# Patient Record
Sex: Female | Born: 1970
Health system: Southern US, Community
[De-identification: ages and names within clinical notes are randomized; demographics above are authoritative.]

## PROBLEM LIST (undated history)

## (undated) DIAGNOSIS — J3089 Other allergic rhinitis: Secondary | ICD-10-CM

## (undated) DIAGNOSIS — I10 Essential (primary) hypertension: Secondary | ICD-10-CM

## (undated) HISTORY — PX: ECTOPIC PREGNANCY SURGERY: SHX613

---

## 1997-12-08 ENCOUNTER — Other Ambulatory Visit: Admission: RE | Admit: 1997-12-08 | Discharge: 1997-12-08 | Payer: Self-pay | Admitting: Obstetrics

## 1997-12-08 ENCOUNTER — Other Ambulatory Visit: Admission: RE | Admit: 1997-12-08 | Discharge: 1997-12-08 | Payer: Self-pay | Admitting: Internal Medicine

## 1998-01-05 ENCOUNTER — Ambulatory Visit (HOSPITAL_COMMUNITY): Admission: RE | Admit: 1998-01-05 | Discharge: 1998-01-05 | Payer: Self-pay | Admitting: Obstetrics

## 1998-01-20 ENCOUNTER — Inpatient Hospital Stay (HOSPITAL_COMMUNITY): Admission: AD | Admit: 1998-01-20 | Discharge: 1998-01-20 | Payer: Self-pay | Admitting: Obstetrics

## 1998-03-24 ENCOUNTER — Ambulatory Visit (HOSPITAL_COMMUNITY): Admission: RE | Admit: 1998-03-24 | Discharge: 1998-03-24 | Payer: Self-pay | Admitting: Obstetrics

## 1998-04-23 ENCOUNTER — Inpatient Hospital Stay (HOSPITAL_COMMUNITY): Admission: AD | Admit: 1998-04-23 | Discharge: 1998-04-23 | Payer: Self-pay | Admitting: Obstetrics

## 1998-04-24 ENCOUNTER — Inpatient Hospital Stay (HOSPITAL_COMMUNITY): Admission: AD | Admit: 1998-04-24 | Discharge: 1998-04-24 | Payer: Self-pay | Admitting: Obstetrics

## 1998-05-09 ENCOUNTER — Inpatient Hospital Stay (HOSPITAL_COMMUNITY): Admission: AD | Admit: 1998-05-09 | Discharge: 1998-05-09 | Payer: Self-pay | Admitting: Obstetrics

## 1998-05-18 ENCOUNTER — Inpatient Hospital Stay (HOSPITAL_COMMUNITY): Admission: AD | Admit: 1998-05-18 | Discharge: 1998-05-18 | Payer: Self-pay | Admitting: Obstetrics

## 1998-06-01 ENCOUNTER — Inpatient Hospital Stay (HOSPITAL_COMMUNITY): Admission: AD | Admit: 1998-06-01 | Discharge: 1998-06-01 | Payer: Self-pay | Admitting: Obstetrics

## 1998-06-14 ENCOUNTER — Inpatient Hospital Stay (HOSPITAL_COMMUNITY): Admission: AD | Admit: 1998-06-14 | Discharge: 1998-06-14 | Payer: Self-pay | Admitting: Obstetrics

## 1998-06-27 ENCOUNTER — Inpatient Hospital Stay (HOSPITAL_COMMUNITY): Admission: AD | Admit: 1998-06-27 | Discharge: 1998-06-30 | Payer: Self-pay | Admitting: Obstetrics

## 1999-02-20 ENCOUNTER — Emergency Department (HOSPITAL_COMMUNITY): Admission: EM | Admit: 1999-02-20 | Discharge: 1999-02-20 | Payer: Self-pay | Admitting: Emergency Medicine

## 1999-03-12 ENCOUNTER — Emergency Department (HOSPITAL_COMMUNITY): Admission: EM | Admit: 1999-03-12 | Discharge: 1999-03-12 | Payer: Self-pay | Admitting: Emergency Medicine

## 1999-03-27 ENCOUNTER — Emergency Department (HOSPITAL_COMMUNITY): Admission: EM | Admit: 1999-03-27 | Discharge: 1999-03-27 | Payer: Self-pay | Admitting: Emergency Medicine

## 1999-04-10 ENCOUNTER — Emergency Department (HOSPITAL_COMMUNITY): Admission: EM | Admit: 1999-04-10 | Discharge: 1999-04-10 | Payer: Self-pay | Admitting: *Deleted

## 1999-04-11 ENCOUNTER — Emergency Department (HOSPITAL_COMMUNITY): Admission: EM | Admit: 1999-04-11 | Discharge: 1999-04-11 | Payer: Self-pay | Admitting: Emergency Medicine

## 1999-04-23 ENCOUNTER — Emergency Department (HOSPITAL_COMMUNITY): Admission: EM | Admit: 1999-04-23 | Discharge: 1999-04-23 | Payer: Self-pay | Admitting: *Deleted

## 1999-07-17 ENCOUNTER — Inpatient Hospital Stay (HOSPITAL_COMMUNITY): Admission: AD | Admit: 1999-07-17 | Discharge: 1999-07-17 | Payer: Self-pay | Admitting: *Deleted

## 1999-07-20 ENCOUNTER — Inpatient Hospital Stay (HOSPITAL_COMMUNITY): Admission: AD | Admit: 1999-07-20 | Discharge: 1999-07-20 | Payer: Self-pay | Admitting: *Deleted

## 2000-03-18 ENCOUNTER — Inpatient Hospital Stay (HOSPITAL_COMMUNITY): Admission: AD | Admit: 2000-03-18 | Discharge: 2000-03-18 | Payer: Self-pay | Admitting: *Deleted

## 2000-04-15 ENCOUNTER — Inpatient Hospital Stay (HOSPITAL_COMMUNITY): Admission: AD | Admit: 2000-04-15 | Discharge: 2000-04-15 | Payer: Self-pay | Admitting: Obstetrics

## 2000-05-10 ENCOUNTER — Inpatient Hospital Stay (HOSPITAL_COMMUNITY): Admission: AD | Admit: 2000-05-10 | Discharge: 2000-05-10 | Payer: Self-pay | Admitting: Obstetrics

## 2000-06-05 ENCOUNTER — Other Ambulatory Visit: Admission: RE | Admit: 2000-06-05 | Discharge: 2000-06-05 | Payer: Self-pay | Admitting: Obstetrics

## 2000-07-07 ENCOUNTER — Emergency Department (HOSPITAL_COMMUNITY): Admission: EM | Admit: 2000-07-07 | Discharge: 2000-07-07 | Payer: Self-pay

## 2000-07-14 ENCOUNTER — Emergency Department (HOSPITAL_COMMUNITY): Admission: EM | Admit: 2000-07-14 | Discharge: 2000-07-14 | Payer: Self-pay | Admitting: *Deleted

## 2000-12-09 ENCOUNTER — Inpatient Hospital Stay (HOSPITAL_COMMUNITY): Admission: AD | Admit: 2000-12-09 | Discharge: 2000-12-09 | Payer: Self-pay | Admitting: Obstetrics

## 2000-12-11 ENCOUNTER — Other Ambulatory Visit: Admission: RE | Admit: 2000-12-11 | Discharge: 2000-12-11 | Payer: Self-pay | Admitting: Obstetrics

## 2001-01-21 ENCOUNTER — Emergency Department (HOSPITAL_COMMUNITY): Admission: EM | Admit: 2001-01-21 | Discharge: 2001-01-21 | Payer: Self-pay | Admitting: *Deleted

## 2001-06-15 ENCOUNTER — Inpatient Hospital Stay (HOSPITAL_COMMUNITY): Admission: AD | Admit: 2001-06-15 | Discharge: 2001-06-15 | Payer: Self-pay | Admitting: Obstetrics

## 2001-06-28 ENCOUNTER — Inpatient Hospital Stay (HOSPITAL_COMMUNITY): Admission: AD | Admit: 2001-06-28 | Discharge: 2001-06-28 | Payer: Self-pay | Admitting: Obstetrics

## 2001-07-23 ENCOUNTER — Inpatient Hospital Stay (HOSPITAL_COMMUNITY): Admission: AD | Admit: 2001-07-23 | Discharge: 2001-07-26 | Payer: Self-pay | Admitting: Obstetrics

## 2001-08-26 ENCOUNTER — Emergency Department (HOSPITAL_COMMUNITY): Admission: EM | Admit: 2001-08-26 | Discharge: 2001-08-26 | Payer: Self-pay | Admitting: Emergency Medicine

## 2001-12-19 ENCOUNTER — Emergency Department (HOSPITAL_COMMUNITY): Admission: EM | Admit: 2001-12-19 | Discharge: 2001-12-19 | Payer: Self-pay | Admitting: Emergency Medicine

## 2002-08-14 ENCOUNTER — Emergency Department (HOSPITAL_COMMUNITY): Admission: EM | Admit: 2002-08-14 | Discharge: 2002-08-14 | Payer: Self-pay | Admitting: Emergency Medicine

## 2003-05-16 ENCOUNTER — Emergency Department (HOSPITAL_COMMUNITY): Admission: EM | Admit: 2003-05-16 | Discharge: 2003-05-16 | Payer: Self-pay | Admitting: Emergency Medicine

## 2003-07-05 ENCOUNTER — Emergency Department (HOSPITAL_COMMUNITY): Admission: EM | Admit: 2003-07-05 | Discharge: 2003-07-05 | Payer: Self-pay | Admitting: Emergency Medicine

## 2003-07-17 ENCOUNTER — Emergency Department (HOSPITAL_COMMUNITY): Admission: EM | Admit: 2003-07-17 | Discharge: 2003-07-17 | Payer: Self-pay | Admitting: Emergency Medicine

## 2004-01-28 ENCOUNTER — Inpatient Hospital Stay (HOSPITAL_COMMUNITY): Admission: AD | Admit: 2004-01-28 | Discharge: 2004-01-28 | Payer: Self-pay | Admitting: Obstetrics

## 2004-02-25 ENCOUNTER — Inpatient Hospital Stay (HOSPITAL_COMMUNITY): Admission: AD | Admit: 2004-02-25 | Discharge: 2004-02-25 | Payer: Self-pay | Admitting: Obstetrics

## 2004-03-31 ENCOUNTER — Emergency Department (HOSPITAL_COMMUNITY): Admission: EM | Admit: 2004-03-31 | Discharge: 2004-03-31 | Payer: Self-pay | Admitting: Emergency Medicine

## 2005-01-02 ENCOUNTER — Emergency Department (HOSPITAL_COMMUNITY): Admission: EM | Admit: 2005-01-02 | Discharge: 2005-01-02 | Payer: Self-pay | Admitting: Emergency Medicine

## 2007-01-18 ENCOUNTER — Emergency Department (HOSPITAL_COMMUNITY): Admission: EM | Admit: 2007-01-18 | Discharge: 2007-01-18 | Payer: Self-pay | Admitting: Emergency Medicine

## 2007-02-01 ENCOUNTER — Observation Stay (HOSPITAL_COMMUNITY): Admission: AD | Admit: 2007-02-01 | Discharge: 2007-02-02 | Payer: Self-pay | Admitting: Obstetrics

## 2007-04-13 ENCOUNTER — Emergency Department (HOSPITAL_COMMUNITY): Admission: EM | Admit: 2007-04-13 | Discharge: 2007-04-13 | Payer: Self-pay | Admitting: Emergency Medicine

## 2007-10-24 ENCOUNTER — Ambulatory Visit: Payer: Self-pay | Admitting: *Deleted

## 2008-01-25 ENCOUNTER — Emergency Department (HOSPITAL_COMMUNITY): Admission: EM | Admit: 2008-01-25 | Discharge: 2008-01-25 | Payer: Self-pay | Admitting: Emergency Medicine

## 2008-09-10 IMAGING — CR DG CHEST 2V
2 series · 2 of 2 positions shown · non-contrast
Comparison: None

CLINICAL DATA: Cough, congestion and sore throat

CHEST - TWO VIEWS

[w chest pa]
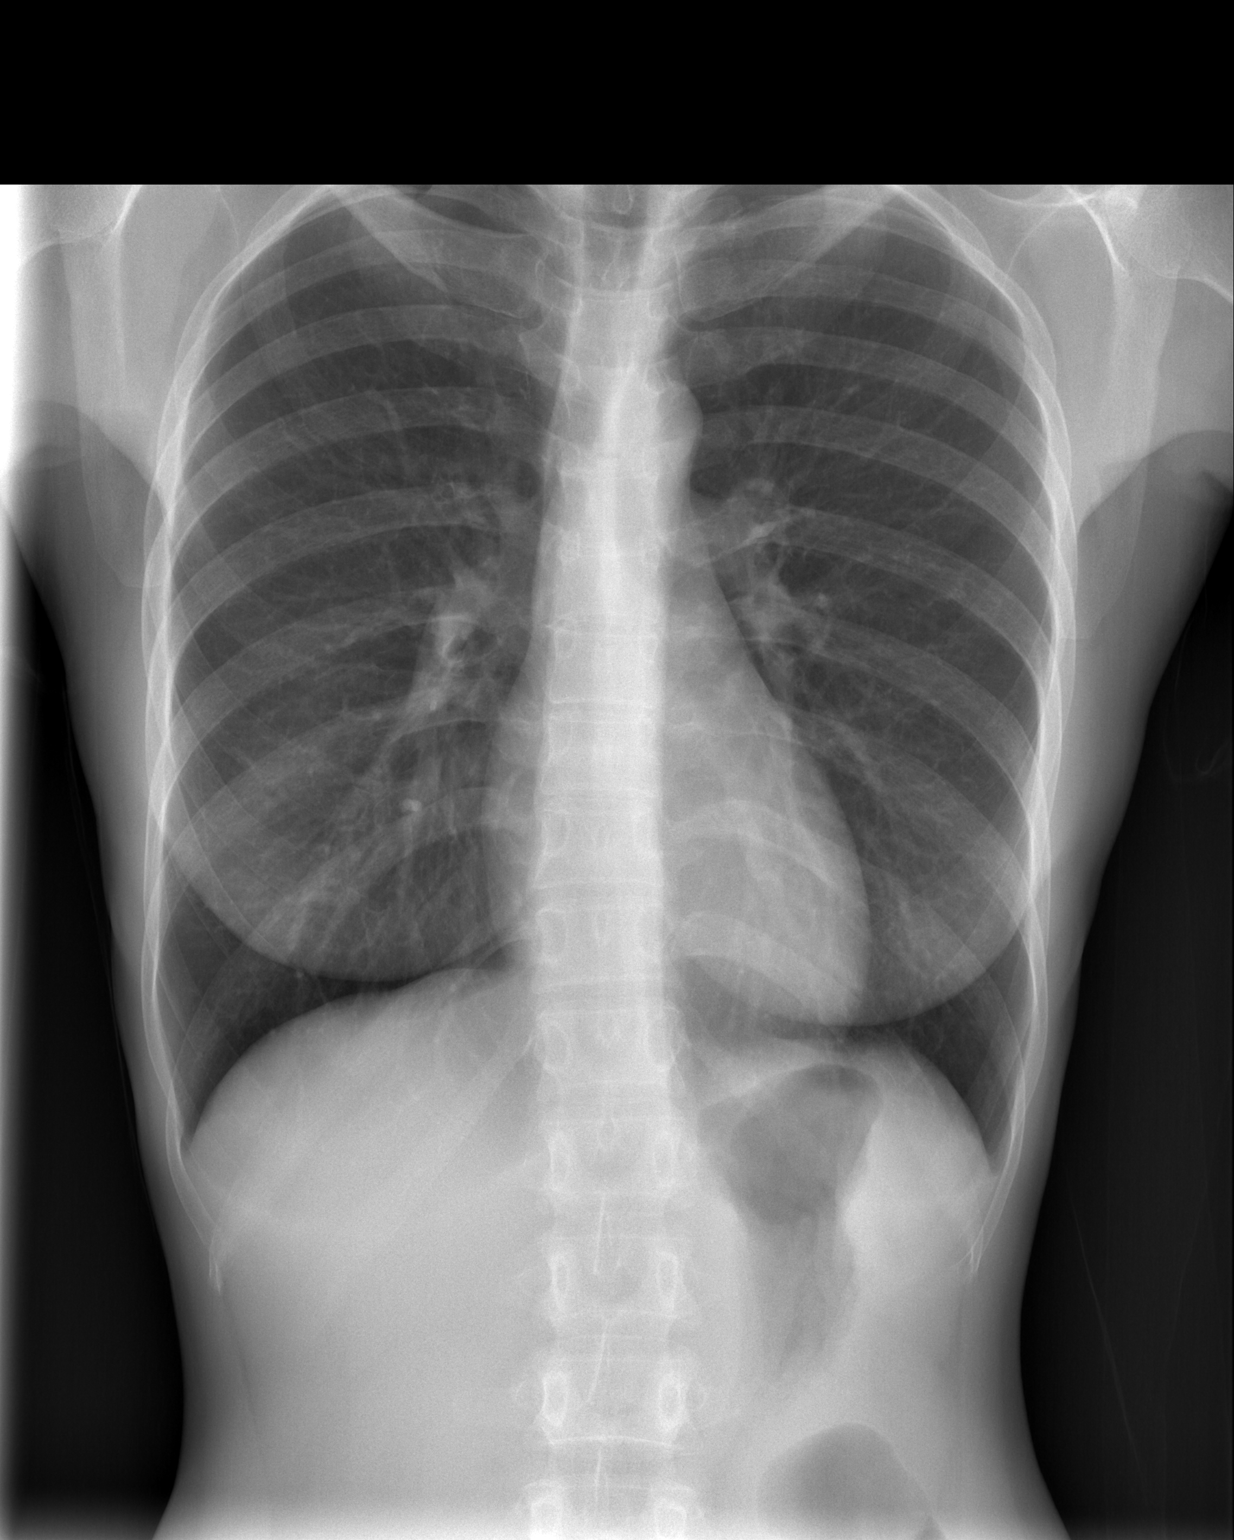

[w chest lat]
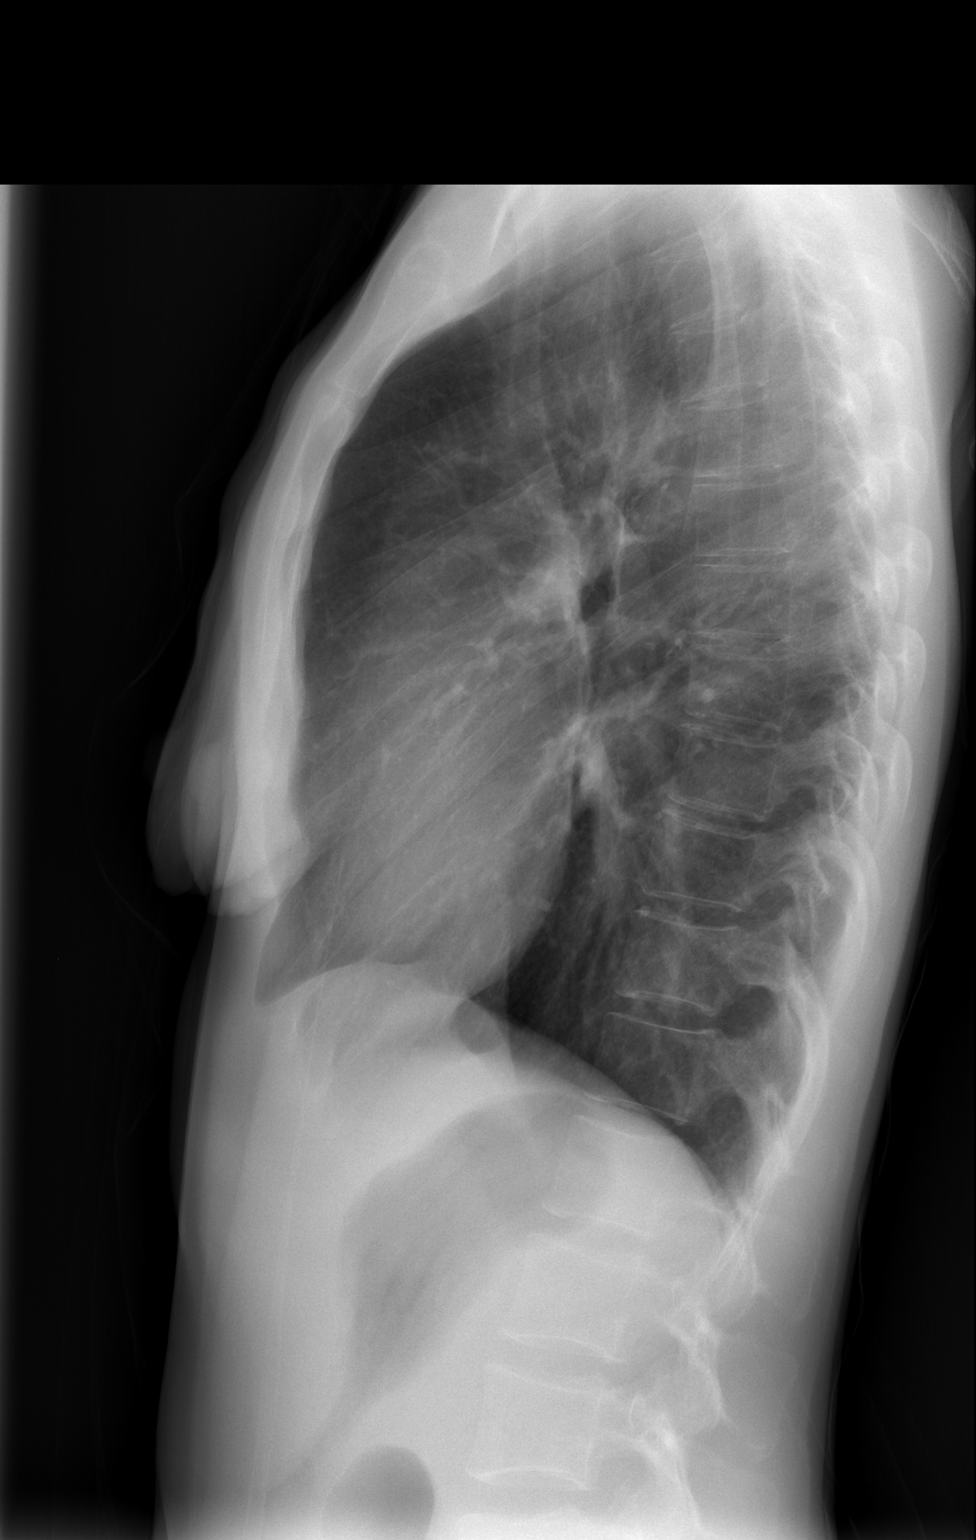

[2 of 2 positions shown; findings below may reference images not displayed]

FINDINGS: The heart size and mediastinal contours are within normal
limits.  Both lungs are clear.  No pleural effusions.  The bony
thorax is normal.
IMPRESSION: No active disease.

## 2008-11-15 ENCOUNTER — Emergency Department (HOSPITAL_COMMUNITY): Admission: EM | Admit: 2008-11-15 | Discharge: 2008-11-15 | Payer: Self-pay | Admitting: Emergency Medicine

## 2009-04-01 ENCOUNTER — Emergency Department (HOSPITAL_COMMUNITY): Admission: EM | Admit: 2009-04-01 | Discharge: 2009-04-01 | Payer: Self-pay | Admitting: Emergency Medicine

## 2010-11-02 ENCOUNTER — Emergency Department (HOSPITAL_BASED_OUTPATIENT_CLINIC_OR_DEPARTMENT_OTHER)
Admission: EM | Admit: 2010-11-02 | Discharge: 2010-11-02 | Disposition: A | Discharge status: Home / Self Care | Payer: Self-pay | Attending: Emergency Medicine | Admitting: Emergency Medicine

## 2010-11-02 DIAGNOSIS — R42 Dizziness and giddiness: Secondary | ICD-10-CM | POA: Insufficient documentation

## 2010-11-02 LAB — URINALYSIS, ROUTINE W REFLEX MICROSCOPIC
Bilirubin Urine: NEGATIVE
Ketones, ur: NEGATIVE mg/dL
Nitrite: NEGATIVE
Urobilinogen, UA: 0.2 mg/dL (ref 0.0–1.0)

## 2010-11-02 LAB — PREGNANCY, URINE: Preg Test, Ur: NEGATIVE

## 2010-12-02 LAB — POCT I-STAT, CHEM 8
BUN: 10 mg/dL (ref 6–23)
Creatinine, Ser: 0.6 mg/dL (ref 0.4–1.2)
Sodium: 140 mEq/L (ref 135–145)
TCO2: 28 mmol/L (ref 0–100)

## 2010-12-02 LAB — URINALYSIS, ROUTINE W REFLEX MICROSCOPIC
Bilirubin Urine: NEGATIVE
Hgb urine dipstick: NEGATIVE
Protein, ur: NEGATIVE mg/dL
Specific Gravity, Urine: 1.004 — ABNORMAL LOW (ref 1.005–1.030)
Urobilinogen, UA: 0.2 mg/dL (ref 0.0–1.0)

## 2011-02-04 ENCOUNTER — Emergency Department (HOSPITAL_BASED_OUTPATIENT_CLINIC_OR_DEPARTMENT_OTHER)
Admission: EM | Admit: 2011-02-04 | Discharge: 2011-02-04 | Disposition: A | Discharge status: Home / Self Care | Payer: Medicaid Other | Attending: Emergency Medicine | Admitting: Emergency Medicine

## 2011-02-04 DIAGNOSIS — R42 Dizziness and giddiness: Secondary | ICD-10-CM | POA: Insufficient documentation

## 2011-02-04 DIAGNOSIS — E860 Dehydration: Secondary | ICD-10-CM | POA: Insufficient documentation

## 2011-02-04 LAB — BASIC METABOLIC PANEL
BUN: 11 mg/dL (ref 6–23)
CO2: 25 mEq/L (ref 19–32)
Chloride: 103 mEq/L (ref 96–112)
Creatinine, Ser: 0.5 mg/dL (ref 0.50–1.10)
Glucose, Bld: 91 mg/dL (ref 70–99)
Potassium: 3.7 mEq/L (ref 3.5–5.1)

## 2011-02-04 LAB — DIFFERENTIAL
Basophils Relative: 1 % (ref 0–1)
Lymphs Abs: 1.6 10*3/uL (ref 0.7–4.0)
Monocytes Absolute: 0.3 10*3/uL (ref 0.1–1.0)
Monocytes Relative: 8 % (ref 3–12)
Neutro Abs: 1.8 10*3/uL (ref 1.7–7.7)

## 2011-02-04 LAB — CBC
HCT: 32.2 % — ABNORMAL LOW (ref 36.0–46.0)
Hemoglobin: 11.3 g/dL — ABNORMAL LOW (ref 12.0–15.0)
MCH: 30.8 pg (ref 26.0–34.0)
MCHC: 35.1 g/dL (ref 30.0–36.0)
MCV: 87.7 fL (ref 78.0–100.0)

## 2011-02-04 LAB — URINALYSIS, ROUTINE W REFLEX MICROSCOPIC
Bilirubin Urine: NEGATIVE
Glucose, UA: NEGATIVE mg/dL
Hgb urine dipstick: NEGATIVE
Ketones, ur: NEGATIVE mg/dL
pH: 6.5 (ref 5.0–8.0)

## 2011-02-04 LAB — PREGNANCY, URINE: Preg Test, Ur: NEGATIVE

## 2011-04-13 ENCOUNTER — Emergency Department (HOSPITAL_BASED_OUTPATIENT_CLINIC_OR_DEPARTMENT_OTHER)
Admission: EM | Admit: 2011-04-13 | Discharge: 2011-04-13 | Disposition: A | Discharge status: Home / Self Care | Payer: Medicaid Other

## 2011-04-13 NOTE — ED Notes (Signed)
Called for triage/exam room x 2 and patient had to leave to go pick up her kids; states she will come back later

## 2011-06-03 LAB — URINALYSIS, ROUTINE W REFLEX MICROSCOPIC
Bilirubin Urine: NEGATIVE
Hgb urine dipstick: NEGATIVE
Nitrite: NEGATIVE
Specific Gravity, Urine: 1.013
pH: 6

## 2011-06-03 LAB — WET PREP, GENITAL
Trich, Wet Prep: NONE SEEN
Yeast Wet Prep HPF POC: NONE SEEN

## 2011-06-03 LAB — GC/CHLAMYDIA PROBE AMP, GENITAL
Chlamydia, DNA Probe: NEGATIVE
GC Probe Amp, Genital: NEGATIVE

## 2011-06-09 LAB — ESTRADIOL: Estradiol: <10

## 2011-06-09 LAB — TSH: TSH: 2.969

## 2011-06-09 LAB — COMPREHENSIVE METABOLIC PANEL
ALT: 19
CO2: 31
Calcium: 9.9
Creatinine, Ser: 0.53
GFR calc non Af Amer: >60
Glucose, Bld: 122 — ABNORMAL HIGH
Sodium: 138
Total Protein: 7.8

## 2011-06-09 LAB — CBC
Hemoglobin: 11.5 — ABNORMAL LOW
MCHC: 33.7
MCV: 91.4
RDW: 12.9

## 2011-06-09 LAB — FOLLICLE STIMULATING HORMONE: FSH: 119.6

## 2011-06-09 LAB — LUTEINIZING HORMONE: LH: 47.9

## 2011-06-09 LAB — PREALBUMIN: Prealbumin: 18.3

## 2011-06-09 LAB — HIV ANTIBODY (ROUTINE TESTING W REFLEX): HIV: NONREACTIVE

## 2014-04-07 ENCOUNTER — Emergency Department (HOSPITAL_BASED_OUTPATIENT_CLINIC_OR_DEPARTMENT_OTHER)
Admission: EM | Admit: 2014-04-07 | Discharge: 2014-04-07 | Disposition: A | Discharge status: Home / Self Care | Payer: Medicaid Other | Attending: Emergency Medicine | Admitting: Emergency Medicine

## 2014-04-07 ENCOUNTER — Encounter (HOSPITAL_BASED_OUTPATIENT_CLINIC_OR_DEPARTMENT_OTHER): Payer: Self-pay | Admitting: Emergency Medicine

## 2014-04-07 DIAGNOSIS — E860 Dehydration: Secondary | ICD-10-CM | POA: Diagnosis present

## 2014-04-07 DIAGNOSIS — N899 Noninflammatory disorder of vagina, unspecified: Secondary | ICD-10-CM | POA: Diagnosis not present

## 2014-04-07 DIAGNOSIS — R0602 Shortness of breath: Secondary | ICD-10-CM | POA: Diagnosis not present

## 2014-04-07 DIAGNOSIS — N898 Other specified noninflammatory disorders of vagina: Secondary | ICD-10-CM

## 2014-04-07 DIAGNOSIS — Z3202 Encounter for pregnancy test, result negative: Secondary | ICD-10-CM | POA: Diagnosis not present

## 2014-04-07 LAB — URINALYSIS, ROUTINE W REFLEX MICROSCOPIC
Bilirubin Urine: NEGATIVE
Glucose, UA: NEGATIVE mg/dL
Hgb urine dipstick: NEGATIVE
Ketones, ur: NEGATIVE mg/dL
Nitrite: NEGATIVE
Protein, ur: NEGATIVE mg/dL
Specific Gravity, Urine: 1.008 (ref 1.005–1.030)
Urobilinogen, UA: 1 mg/dL (ref 0.0–1.0)
pH: 7.5 (ref 5.0–8.0)

## 2014-04-07 LAB — CBC WITH DIFFERENTIAL/PLATELET
Basophils Absolute: 0 K/uL (ref 0.0–0.1)
Basophils Relative: 1 % (ref 0–1)
Eosinophils Absolute: 0 K/uL (ref 0.0–0.7)
Eosinophils Relative: 1 % (ref 0–5)
HCT: 33.4 % — ABNORMAL LOW (ref 36.0–46.0)
Hemoglobin: 11.4 g/dL — ABNORMAL LOW (ref 12.0–15.0)
Lymphocytes Relative: 45 % (ref 12–46)
Lymphs Abs: 1.4 K/uL (ref 0.7–4.0)
MCH: 30.5 pg (ref 26.0–34.0)
MCHC: 34.1 g/dL (ref 30.0–36.0)
MCV: 89.3 fL (ref 78.0–100.0)
Monocytes Absolute: 0.2 K/uL (ref 0.1–1.0)
Monocytes Relative: 7 % (ref 3–12)
Neutro Abs: 1.4 K/uL — ABNORMAL LOW (ref 1.7–7.7)
Neutrophils Relative %: 46 % (ref 43–77)
Platelets: 180 K/uL (ref 150–400)
RBC: 3.74 MIL/uL — ABNORMAL LOW (ref 3.87–5.11)
RDW: 12.8 % (ref 11.5–15.5)
WBC: 3.1 K/uL — ABNORMAL LOW (ref 4.0–10.5)

## 2014-04-07 LAB — BASIC METABOLIC PANEL WITH GFR
Anion gap: 13 (ref 5–15)
BUN: 10 mg/dL (ref 6–23)
CO2: 28 meq/L (ref 19–32)
Calcium: 10.1 mg/dL (ref 8.4–10.5)
Chloride: 101 meq/L (ref 96–112)
Creatinine, Ser: 0.6 mg/dL (ref 0.50–1.10)
GFR calc Af Amer: >90 mL/min
GFR calc non Af Amer: >90 mL/min
Glucose, Bld: 94 mg/dL (ref 70–99)
Potassium: 3.8 meq/L (ref 3.7–5.3)
Sodium: 142 meq/L (ref 137–147)

## 2014-04-07 LAB — URINE MICROSCOPIC-ADD ON

## 2014-04-07 LAB — PREGNANCY, URINE: Preg Test, Ur: NEGATIVE

## 2014-04-07 MED ORDER — METRONIDAZOLE 500 MG PO TABS: 500.0000 mg | ORAL_TABLET | Freq: Two times a day (BID) | ORAL | Status: DC

## 2014-04-07 MED ORDER — SODIUM CHLORIDE 0.9 % IV SOLN: INTRAVENOUS | Status: DC

## 2014-04-07 MED ORDER — SODIUM CHLORIDE 0.9 % IV BOLUS (SEPSIS)
1000.0000 mL | Freq: Once | INTRAVENOUS | Status: AC
  Administered 2014-04-07: 1000 mL via INTRAVENOUS

## 2014-04-07 NOTE — ED Provider Notes (Addendum)
CSN: [id number]     Arrival date & time 04/07/14  1155 History   First MD Initiated Contact with Patient 04/07/14 1156     Chief Complaint  Patient presents with  . Dehydration     (Consider location/radiation/quality/duration/timing/severity/associated sxs/prior Treatment) The history is provided by the patient.   to sit or 43 year old female seen at urgent care on Saturday diagnosed with bacterial vaginosis and possible urinary tract infection. Patient was prescribed Flagyl gel and antibiotic that she does not know the name of. Did not get either one of them filled yet. Patient feels as if she's dehydrated starting on Sunday. Patient's complaints include some vaginal irritation no abnormal pain no nausea vomiting no diarrhea some discomfort with urination. And some mild shortness of breath. No chest pain no bowel pain. No fevers. No significant vaginal discharge. Patient feels that she's dehydrated because she feels lightheaded and was having muscle cramps on Sunday none today.  History reviewed. No pertinent past medical history. Past Surgical History  Procedure Laterality Date  . Ectopic pregnancy surgery     History reviewed. No pertinent family history. History  Substance Use Topics  . Smoking status: Never Smoker   . Smokeless tobacco: Not on file  . Alcohol Use: No   OB History   Grav Para Term Preterm Abortions TAB SAB Ect Mult Living                 Review of Systems  Constitutional: Negative for fever.  HENT: Negative for congestion.   Eyes: Negative for redness.  Respiratory: Positive for shortness of breath.   Cardiovascular: Negative for chest pain.  Gastrointestinal: Negative for nausea, vomiting and abdominal pain.  Genitourinary: Positive for dysuria and vaginal pain. Negative for vaginal bleeding and vaginal discharge.  Musculoskeletal: Negative for back pain.  Skin: Negative for rash.  Neurological: Negative for headaches.  Hematological: Does not  bruise/bleed easily.  Psychiatric/Behavioral: Negative for confusion.      Allergies  Review of patient's allergies indicates no known allergies.  Home Medications   Prior to Admission medications   Medication Sig Start Date End Date Taking? Authorizing Provider  metroNIDAZOLE (FLAGYL) 500 MG tablet Take 1 tablet (500 mg total) by mouth 2 (two) times daily. 04/07/14   Vanetta MuldersScott Jaqueline Uber, MD   BP 140/74  Pulse 117  Temp(Src) 98.5 F (36.9 C) (Oral)  Resp 18  Ht 5\' 4"  (1.626 m)  Wt 93 lb (42.185 kg)  BMI 15.96 kg/m2  SpO2 100% Physical Exam  Nursing note and vitals reviewed. Constitutional: She is oriented to person, place, and time. She appears well-developed and well-nourished. No distress.  HENT:  Head: Normocephalic and atraumatic.  Mouth/Throat: Oropharynx is clear and moist.  Eyes: Conjunctivae and EOM are normal. Pupils are equal, round, and reactive to light.  Neck: Normal range of motion.  Cardiovascular: Normal rate, regular rhythm and normal heart sounds.   Pulmonary/Chest: Effort normal and breath sounds normal. No respiratory distress.  Abdominal: Soft. Bowel sounds are normal. There is no tenderness.  Musculoskeletal: Normal range of motion. She exhibits no edema.  Neurological: She is alert and oriented to person, place, and time. No cranial nerve deficit. She exhibits normal muscle tone. Coordination normal.  Skin: Skin is warm. No rash noted.    ED Course  Procedures (including critical care time) Labs Review Labs Reviewed  CBC WITH DIFFERENTIAL - Abnormal; Notable for the following:    WBC 3.1 (*)    RBC 3.74 (*)  Hemoglobin 11.4 (*)    HCT 33.4 (*)    Neutro Abs 1.4 (*)    All other components within normal limits  URINALYSIS, ROUTINE W REFLEX MICROSCOPIC - Abnormal; Notable for the following:    Leukocytes, UA SMALL (*)    All other components within normal limits  URINE MICROSCOPIC-ADD ON - Abnormal; Notable for the following:    Squamous  Epithelial / LPF FEW (*)    Bacteria, UA MANY (*)    All other components within normal limits  URINE CULTURE  BASIC METABOLIC PANEL  PREGNANCY, URINE   Results for orders placed during the hospital encounter of 04/07/14  BASIC METABOLIC PANEL      Result Value Ref Range   Sodium 142  137 - 147 mEq/L   Potassium 3.8  3.7 - 5.3 mEq/L   Chloride 101  96 - 112 mEq/L   CO2 28  19 - 32 mEq/L   Glucose, Bld 94  70 - 99 mg/dL   BUN 10  6 - 23 mg/dL   Creatinine, Ser 1.61  0.50 - 1.10 mg/dL   Calcium 09.6  8.4 - 04.5 mg/dL   GFR calc non Af Amer >90  >90 mL/min   GFR calc Af Amer >90  >90 mL/min   Anion gap 13  5 - 15  CBC WITH DIFFERENTIAL      Result Value Ref Range   WBC 3.1 (*) 4.0 - 10.5 K/uL   RBC 3.74 (*) 3.87 - 5.11 MIL/uL   Hemoglobin 11.4 (*) 12.0 - 15.0 g/dL   HCT 40.9 (*) 81.1 - 91.4 %   MCV 89.3  78.0 - 100.0 fL   MCH 30.5  26.0 - 34.0 pg   MCHC 34.1  30.0 - 36.0 g/dL   RDW 78.2  95.6 - 21.3 %   Platelets 180  150 - 400 K/uL   Neutrophils Relative % 46  43 - 77 %   Neutro Abs 1.4 (*) 1.7 - 7.7 K/uL   Lymphocytes Relative 45  12 - 46 %   Lymphs Abs 1.4  0.7 - 4.0 K/uL   Monocytes Relative 7  3 - 12 %   Monocytes Absolute 0.2  0.1 - 1.0 K/uL   Eosinophils Relative 1  0 - 5 %   Eosinophils Absolute 0.0  0.0 - 0.7 K/uL   Basophils Relative 1  0 - 1 %   Basophils Absolute 0.0  0.0 - 0.1 K/uL  URINALYSIS, ROUTINE W REFLEX MICROSCOPIC      Result Value Ref Range   Color, Urine YELLOW  YELLOW   APPearance CLEAR  CLEAR   Specific Gravity, Urine 1.008  1.005 - 1.030   pH 7.5  5.0 - 8.0   Glucose, UA NEGATIVE  NEGATIVE mg/dL   Hgb urine dipstick NEGATIVE  NEGATIVE   Bilirubin Urine NEGATIVE  NEGATIVE   Ketones, ur NEGATIVE  NEGATIVE mg/dL   Protein, ur NEGATIVE  NEGATIVE mg/dL   Urobilinogen, UA 1.0  0.0 - 1.0 mg/dL   Nitrite NEGATIVE  NEGATIVE   Leukocytes, UA SMALL (*) NEGATIVE  PREGNANCY, URINE      Result Value Ref Range   Preg Test, Ur NEGATIVE  NEGATIVE   URINE MICROSCOPIC-ADD ON      Result Value Ref Range   Squamous Epithelial / LPF FEW (*) RARE   WBC, UA 0-2  <3 WBC/hpf   RBC / HPF 0-2  <3 RBC/hpf   Bacteria, UA MANY (*) RARE  Imaging Review No results found.   EKG Interpretation None      MDM   Final diagnoses:  Vaginal irritation    The patient seen at urgent care on Saturday. Diagnosed with bacterial vaginosis started on Flagyl gel patient's pregnancy test is negative today. Patient cannot afford the price of the Flagyl gel. Patient was also told that she may have a urinary tract infection and was started on antibiotics. Patient has not taken an antibiotic either. Patient's here feeling as if she's been dehydrated since Sunday. She's been feeling lightheaded with some muscle cramps. No nausea no vomiting no diarrhea no bowel pain. Does have some vaginal irritation and some discomfort with urination. Does have some shortness of breath but no chest pain no bowel pain.   Today's urine looks like it has bacteria but no evidence of any white blood cells suspect this is contamination from the vaginal area unlikely to be consistent with a urinary tract infection however urine culture sent to confirm. Patient clearly needs to take Flagyl based on the findings from the urgent care on Saturday. No lytic exam not repeated here. Patient clinically without evidence of dehydration but was hydrated with a liter of fluid. No leukocytosis no significant electrolyte abnormalities. Urine was not concentrated.  Patient will be switched over to the Flagyl in the pill form.    Vanetta Mulders, MD 04/07/14 1359  Vanetta Mulders, MD 04/07/14 [phone]

## 2014-04-07 NOTE — ED Notes (Signed)
Pt states she feels dehydrated since Sunday.  Pt states she ws was seen at Urgent care on Saturday and dx with UTI. Pt states she started feeling lightheaded and muscle craps Sunday.

## 2014-04-07 NOTE — ED Notes (Signed)
Provider at bedside for pt eval.

## 2014-04-07 NOTE — Discharge Instructions (Signed)
Today's lab workup without evidence of urinary tract infection. However we have sent a urine culture to confirm. Urine culture will take a couple days to come back. He will be notified if it's abnormal. Based on the pelvic exam from the urgent care it sounds as if you do require treatment for bacterial vaginosis with Flagyl. Take either the gel version that they prescribed or if it's cheaper take the pill version prescribed here. Return for any newer worse symptoms. No evidence of any significant dehydration.

## 2014-04-10 LAB — URINE CULTURE: Colony Count: 15000

## 2014-05-30 ENCOUNTER — Emergency Department (HOSPITAL_BASED_OUTPATIENT_CLINIC_OR_DEPARTMENT_OTHER)
Admission: EM | Admit: 2014-05-30 | Discharge: 2014-05-30 | Disposition: A | Discharge status: Home / Self Care | Payer: Medicaid Other | Attending: Emergency Medicine | Admitting: Emergency Medicine

## 2014-05-30 ENCOUNTER — Encounter (HOSPITAL_BASED_OUTPATIENT_CLINIC_OR_DEPARTMENT_OTHER): Payer: Self-pay | Admitting: Emergency Medicine

## 2014-05-30 DIAGNOSIS — R0981 Nasal congestion: Secondary | ICD-10-CM | POA: Insufficient documentation

## 2014-05-30 DIAGNOSIS — J3489 Other specified disorders of nose and nasal sinuses: Secondary | ICD-10-CM | POA: Insufficient documentation

## 2014-05-30 DIAGNOSIS — N760 Acute vaginitis: Secondary | ICD-10-CM | POA: Insufficient documentation

## 2014-05-30 DIAGNOSIS — J029 Acute pharyngitis, unspecified: Secondary | ICD-10-CM | POA: Diagnosis not present

## 2014-05-30 DIAGNOSIS — Z79899 Other long term (current) drug therapy: Secondary | ICD-10-CM | POA: Diagnosis not present

## 2014-05-30 DIAGNOSIS — E860 Dehydration: Secondary | ICD-10-CM | POA: Diagnosis not present

## 2014-05-30 DIAGNOSIS — B9689 Other specified bacterial agents as the cause of diseases classified elsewhere: Secondary | ICD-10-CM

## 2014-05-30 DIAGNOSIS — Z87891 Personal history of nicotine dependence: Secondary | ICD-10-CM | POA: Diagnosis not present

## 2014-05-30 DIAGNOSIS — R002 Palpitations: Secondary | ICD-10-CM | POA: Diagnosis present

## 2014-05-30 LAB — CBC
HEMATOCRIT: 33.3 % — AB (ref 36.0–46.0)
Hemoglobin: 11.4 g/dL — ABNORMAL LOW (ref 12.0–15.0)
MCH: 30.7 pg (ref 26.0–34.0)
MCHC: 34.2 g/dL (ref 30.0–36.0)
MCV: 89.8 fL (ref 78.0–100.0)
Platelets: 217 10*3/uL (ref 150–400)
RBC: 3.71 MIL/uL — AB (ref 3.87–5.11)
RDW: 13 % (ref 11.5–15.5)
WBC: 4.3 10*3/uL (ref 4.0–10.5)

## 2014-05-30 LAB — COMPREHENSIVE METABOLIC PANEL
ALT: 14 U/L (ref 0–35)
ANION GAP: 14 (ref 5–15)
AST: 21 U/L (ref 0–37)
Albumin: 4.6 g/dL (ref 3.5–5.2)
Alkaline Phosphatase: 34 U/L — ABNORMAL LOW (ref 39–117)
BILIRUBIN TOTAL: 0.5 mg/dL (ref 0.3–1.2)
BUN: 10 mg/dL (ref 6–23)
CO2: 26 meq/L (ref 19–32)
CREATININE: 0.6 mg/dL (ref 0.50–1.10)
Calcium: 10.1 mg/dL (ref 8.4–10.5)
Chloride: 103 mEq/L (ref 96–112)
Glucose, Bld: 94 mg/dL (ref 70–99)
Potassium: 3.5 mEq/L — ABNORMAL LOW (ref 3.7–5.3)
Sodium: 143 mEq/L (ref 137–147)
Total Protein: 8.1 g/dL (ref 6.0–8.3)

## 2014-05-30 LAB — URINALYSIS, ROUTINE W REFLEX MICROSCOPIC
Bilirubin Urine: NEGATIVE
GLUCOSE, UA: NEGATIVE mg/dL
Hgb urine dipstick: NEGATIVE
Ketones, ur: NEGATIVE mg/dL
Nitrite: NEGATIVE
Protein, ur: NEGATIVE mg/dL
Specific Gravity, Urine: 1.004 — ABNORMAL LOW (ref 1.005–1.030)
Urobilinogen, UA: 1 mg/dL (ref 0.0–1.0)
pH: 7 (ref 5.0–8.0)

## 2014-05-30 LAB — WET PREP, GENITAL
TRICH WET PREP: NONE SEEN
Yeast Wet Prep HPF POC: NONE SEEN

## 2014-05-30 LAB — URINE MICROSCOPIC-ADD ON

## 2014-05-30 MED ORDER — SODIUM CHLORIDE 0.9 % IV BOLUS (SEPSIS): 1000.0000 mL | Freq: Once | INTRAVENOUS | Status: DC

## 2014-05-30 MED ORDER — METRONIDAZOLE 1 % EX GEL: Freq: Every day | CUTANEOUS | Status: DC

## 2014-05-30 MED ORDER — SODIUM CHLORIDE 0.9 % IV BOLUS (SEPSIS)
1000.0000 mL | Freq: Once | INTRAVENOUS | Status: AC
  Administered 2014-05-30: 1000 mL via INTRAVENOUS

## 2014-05-30 MED ORDER — METRONIDAZOLE 500 MG PO TABS: 500.0000 mg | ORAL_TABLET | Freq: Two times a day (BID) | ORAL | Status: DC

## 2014-05-30 NOTE — Discharge Instructions (Signed)
Bacterial Vaginosis Bacterial vaginosis is a vaginal infection that occurs when the normal balance of bacteria in the vagina is disrupted. It results from an overgrowth of certain bacteria. This is the most common vaginal infection in women of childbearing age. Treatment is important to prevent complications, especially in pregnant women, as it can cause a premature delivery. CAUSES  Bacterial vaginosis is caused by an increase in harmful bacteria that are normally present in smaller amounts in the vagina. Several different kinds of bacteria can cause bacterial vaginosis. However, the reason that the condition develops is not fully understood. RISK FACTORS Certain activities or behaviors can put you at an increased risk of developing bacterial vaginosis, including:  Having a new sex partner or multiple sex partners.  Douching.  Using an intrauterine device (IUD) for contraception. Women do not get bacterial vaginosis from toilet seats, bedding, swimming pools, or contact with objects around them. SIGNS AND SYMPTOMS  Some women with bacterial vaginosis have no signs or symptoms. Common symptoms include:  Grey vaginal discharge.  A fishlike odor with discharge, especially after sexual intercourse.  Itching or burning of the vagina and vulva.  Burning or pain with urination. DIAGNOSIS  Your health care provider will take a medical history and examine the vagina for signs of bacterial vaginosis. A sample of vaginal fluid may be taken. Your health care provider will look at this sample under a microscope to check for bacteria and abnormal cells. A vaginal pH test may also be done.  TREATMENT  Bacterial vaginosis may be treated with antibiotic medicines. These may be given in the form of a pill or a vaginal cream. A second round of antibiotics may be prescribed if the condition comes back after treatment.  HOME CARE INSTRUCTIONS   Only take over-the-counter or prescription medicines as  directed by your health care provider.  If antibiotic medicine was prescribed, take it as directed. Make sure you finish it even if you start to feel better.  Do not have sex until treatment is completed.  Tell all sexual partners that you have a vaginal infection. They should see their health care provider and be treated if they have problems, such as a mild rash or itching.  Practice safe sex by using condoms and only having one sex partner. SEEK MEDICAL CARE IF:   Your symptoms are not improving after 3 days of treatment.  You have increased discharge or pain.  You have a fever. MAKE SURE YOU:   Understand these instructions.  Will watch your condition.  Will get help right away if you are not doing well or get worse. FOR MORE INFORMATION  Centers for Disease Control and Prevention, Division of STD Prevention: SolutionApps.co.zawww.cdc.gov/std American Sexual Health Association (ASHA): ww Dehydration, Adult Dehydration is when you lose more fluids from the body than you take in. Vital organs like the kidneys, brain, and heart cannot function without a proper amount of fluids and salt. Any loss of fluids from the body can cause dehydration.  CAUSES   Vomiting.  Diarrhea.  Excessive sweating.  Excessive urine output.  Fever. SYMPTOMS  Mild dehydration  Thirst.  Dry lips.  Slightly dry mouth. Moderate dehydration  Very dry mouth.  Sunken eyes.  Skin does not bounce back quickly when lightly pinched and released.  Dark urine and decreased urine production.  Decreased tear production.  Headache. Severe dehydration  Very dry mouth.  Extreme thirst.  Rapid, weak pulse (more than 100 beats per minute at rest).  Cold  hands and feet.  Not able to sweat in spite of heat and temperature.  Rapid breathing.  Blue lips.  Confusion and lethargy.  Difficulty being awakened.  Minimal urine production.  No tears. DIAGNOSIS  Your caregiver will diagnose dehydration  based on your symptoms and your exam. Blood and urine tests will help confirm the diagnosis. The diagnostic evaluation should also identify the cause of dehydration. TREATMENT  Treatment of mild or moderate dehydration can often be done at home by increasing the amount of fluids that you drink. It is best to drink small amounts of fluid more often. Drinking too much at one time can make vomiting worse. Refer to the home care instructions below. Severe dehydration needs to be treated at the hospital where you will probably be given intravenous (IV) fluids that contain water and electrolytes. HOME CARE INSTRUCTIONS   Ask your caregiver about specific rehydration instructions.  Drink enough fluids to keep your urine clear or pale yellow.  Drink small amounts frequently if you have nausea and vomiting.  Eat as you normally do.  Avoid:  Foods or drinks high in sugar.  Carbonated drinks.  Juice.  Extremely hot or cold fluids.  Drinks with caffeine.  Fatty, greasy foods.  Alcohol.  Tobacco.  Overeating.  Gelatin desserts.  Wash your hands well to avoid spreading bacteria and viruses.  Only take over-the-counter or prescription medicines for pain, discomfort, or fever as directed by your caregiver.  Ask your caregiver if you should continue all prescribed and over-the-counter medicines.  Keep all follow-up appointments with your caregiver. SEEK MEDICAL CARE IF:  You have abdominal pain and it increases or stays in one area (localizes).  You have a rash, stiff neck, or severe headache.  You are irritable, sleepy, or difficult to awaken.  You are weak, dizzy, or extremely thirsty. SEEK IMMEDIATE MEDICAL CARE IF:   You are unable to keep fluids down or you get worse despite treatment.  You have frequent episodes of vomiting or diarrhea.  You have blood or green matter (bile) in your vomit.  You have blood in your stool or your stool looks black and tarry.  You have  not urinated in 6 to 8 hours, or you have only urinated a small amount of very dark urine.  You have a fever.  You faint. MAKE SURE YOU:   Understand these instructions.  Will watch your condition.  Will get help right away if you are not doing well or get worse. Document Released: 08/08/2005 Document Revised: 10/31/2011 Document Reviewed: 03/28/2011 [facility] Patient Information 2015 [place], [place]. This information is not intended to replace advice given to you by your health care provider. Make sure you discuss any questions you have with your health care provider. [email or web address]    Emergency Department Resource Guide 1) Find a Doctor and Pay Out of Pocket Although you won't have to find out who is covered by your insurance plan, it is a good idea to ask around and get recommendations. You will then need to call the office and see if the doctor you have chosen will accept you as a new patient and what types of options they offer for patients who are self-pay. Some doctors offer discounts or will set up payment plans for their patients who do not have insurance, but you will need to ask so you aren't surprised when you get to your appointment.  2) Contact Your Local Health Department Not all health departments have doctors that can see patients  for sick visits, but many do, so it is worth a call to see if yours does. If you don't know where your local health department is, you can check in your phone book. The CDC also has a tool to help you locate your state's health department, and many state websites also have listings of all of their local health departments.  3) Find a Walk-in Clinic If your illness is not likely to be very severe or complicated, you may want to try a walk in clinic. These are popping up all over the country in pharmacies, drugstores, and shopping centers. They're usually staffed by nurse practitioners or physician assistants that have been trained to treat common  illnesses and complaints. They're usually fairly quick and inexpensive. However, if you have serious medical issues or chronic medical problems, these are probably not your best option.  No Primary Care Doctor: - Call Health Connect at  [phone] - they can help you locate a primary care doctor that  accepts your insurance, provides certain services, etc. - Physician Referral Service- [phone]  Chronic Pain Problems: Organization         Address  Phone   Notes  [facility]  [phone] Patients need to be referred by their primary care doctor.   Medication Assistance: Organization         Address  Phone   Notes  [facility] Medication [facility] [address] [place] [place], [place] [address] [phone] --Must be a resident of [facility] -- Must have NO insurance coverage whatsoever (no Medicaid/ Medicare, etc.) -- The pt. MUST have a primary care doctor that directs their care regularly and follows them in the community   [facility]  [phone]   [employer]  [phone]    Agencies that provide inexpensive medical care: Organization         Address  Phone   Notes  [facility]  [phone]   [facility]    [phone]   [facility] [address] [place], [place] [address] [phone]   [facility] [address] [place], [place] [phone]   Planned Parenthood    [phone]   [facility]    [phone]   [facility]  [address], [place] Phone:  [phone], Fax:  [phone] Hours of Operation:  9 am - 6 pm, M-F.  Also accepts Medicaid/Medicare and self-pay.  [facility]  [address], [place] Phone: [phone], Fax: [phone]. Hours of Operation:  8:30 am - 5:30 pm, M-F.  Also accepts Medicaid and self-pay.  [facility] [place]  [address] [place] Phone: [phone]   [facility] [address] Natasha Bence [place], [place] [phone], Ext. [phone] Mondays & Thursdays: 7-9 AM.  First 15 patients are seen on a first come, first serve basis.    Medicaid-accepting [facility] Providers:  Organization         Address  Phone   Notes  [facility] [address], [place] [phone] Also accepts self-pay patients.  [facility] [address] Laurell Josephs [place], [place]  [phone]   [facility] [address], [place] [phone]   [facility] [address] [place] [phone])  [phone]   Renaye Rakers [address], [place]   [phone] Only accepts [place] patients after they have their name applied to their card.   Self-Pay (no insurance) in [facility]:  Organization         Address  Phone   Notes  Sickle Cell Patients, [facility] [address] [place], [place] [phone]   [facility] [address] [place], [place] [phone]   [facility] [place]  [address], [place] [phone]   [facility]/Dr. Osei-Bonsu  [address], [place] or [address], [place] [phone] Phone number for both Center and [place] locations is the same.  Urgent Medical and [facility] [address], [place] [phone]   [facility] [address] [place] or [address] [phone]   [facility] [address], [place] [phone], phone; [phone], fax Sees patients 1st and 3rd Saturday of every month.  Must not qualify for public or private insurance (i.e. Medicaid, Medicare, [place] Health Choice, Veterans' Benefits)  Household income should be no more than 200% of the  poverty level The clinic cannot treat you if you are pregnant or think you are pregnant  Sexually transmitted diseases are not treated at the clinic.    Dental Care: Organization         Address  Phone  Notes  [facility] Department of [facility] [address] [place], [place] [phone] Accepts children up to age 54 who are enrolled in [place] or [place] Health Choice; pregnant women with a Medicaid card; and children who have applied for Medicaid or [place] Health Choice, but were declined, whose parents can pay a reduced fee at time of service.  [employer] Department of [facility]  [address], [place] [phone] Accepts children up to age 22 who are enrolled in [place] or [place] Health Choice; pregnant women with a Medicaid card; and children who have applied for Medicaid or [place] Health Choice, but were declined, whose parents can pay a reduced fee at time of service.  [facility]  [address] [place], [place] [phone] Patients are seen by appointment only. Walk-ins are not accepted. [facility] will see patients 37 years of age and older. Monday - Tuesday (8am-5pm) Most Wednesdays (8:30-5pm) $30 per visit, cash only  [facility]  [address], [facility] [phone] Patients are seen by appointment only. Walk-ins are not accepted. [facility] will see patients 16 years of age and older. One Wednesday Evening (Monthly: Volunteer Based).  $30 per visit, cash only  [employer]  [phone] for adults; Children under age 63, call Graduate Pediatric Dentistry at [phone]. Children aged 65-14, please call [phone] to request a pediatric application.  Dental services are provided in all areas of dental care including fillings, crowns and bridges, complete and partial dentures, implants, gum treatment, root canals, and extractions.  Preventive care is also provided. Treatment is provided to both adults and children. Patients are selected via a lottery and there is often a waiting list.   [facility] [address] [place]  [phone] [email or web address]   [facility] [address], [place],  [place] [phone], Ext. [phone] Second and Fourth Thursday of each month, opens at 6:30 AM; Clinic ends at 9 AM.  Patients are seen on a first-come first-served basis, and a limited number are seen during each clinic.   [facility]  [address], [place], [place] [phone]) [phone]   Eligibility Requirements You must have lived in [place], [place], or [place] for at least the last three months.   You cannot be eligible for state or federal sponsored [place] Cityhealthcare insurance, including CIG[employer]eterans Administration, [place]icaid, or [employer]icare.   You generally cannot be eligible for healthcare insurance through your employer.    How to apply: Eligibility screenings are held every Tuesday and Wednesday afternoon from 1:00 pm until 4:00 pm. You do not need an appointment for the interview!  [facility] [address], [place], [place] [phone]   [facility]  [phone]   [facility]  [phone]   [facility]  [phone]    Behavioral Health Resources in the Community: Intensive Outpatient Programs Organization         Address  Phone  Notes  [facility] [address], [place], [place] [phone]   [facility] [address], [place], [place] [phone]   [facility] [address], [place], [place]  [phone]   [facility] [address],  [place], [place] [phone] or [phone]   Substance Abuse Resources Organization         Address  Phone  Notes  [facility]  [phone]   Addiction  [facility]  [phone]   [facility]  [phone]   [facility]  [phone]   Residential & Outpatient Substance Abuse Program  [phone]   Psychological Services Organization         Address  Phone  Notes  [facility]  [phone]- [phone]   [facility]  [phone]- [phone]   [facility] [address], [place] [phone] or [phone]    Mobile Crisis Teams Organization         Address  Phone  Notes  [facility], Mobile Crisis Care Unit  [phone]   [facility]  [address] [place], [place] [phone]   Doristine LocksSharon DeEsch [address] [place] [phone]    Self-Help/Support Groups Organization         Address  Phone             Notes  [facility] [place] - variety of support groups  336- [phone] Call for more information  Narcotics Anonymous (NA), [facility] [address], [place]  2 meetings at this location   [facility]idential Treatment Programs Organization         Address  Phone  Notes  [facility] [address],    [place]  [phone]   [facility]  [address], [place] [phone], [place], [place] [phone]   [facility] [address], [place] [phone] Admissions: 8am-3pm M-F  [facility] [address]    [place], [place] [phone]   [facility] [address], [place], [place] [phone]   [facility] [address]  [place], [place] [phone]   [facility] - Intensive Outpatient [address], [place], [place] [phone]   [facility] (Addiction  [facility] [address]  [place], [place] [phone] or [phone]   [facility] ([facility]) [address] [place], [place] [phone] Accepts Medicaid  [facility] [address]  [place]  [phone] Substance Abuse/Addiction Treatment   [facility] Organization         Address  Phone  Notes  [facility]  [phone]   Angie Fava, PhD [address] [place], [place]   [phone] or [phone]   [facility]   [address] [place], [place] [phone]   [facility] [address], [place], [place] [phone] Insurance/Medicaid/sponsorship through [facility] and Families [address]                                    [place], [place] [phone] Therapy/tele-psych/case  [facility] [address], [place] [phone]    Dr. Lolly Mustache  [phone]   [facility]  [employer] [facility] Dept. 1) [address] [place] 2) [address] [place] 3)  [address], [place] [phone]  [phone]   [facility] Child Abuse Hotline [phone]) [phone] or [phone] (After Hours)       Document Released: 08/08/2005 Document Revised: 05/29/2013 Document Reviewed: 03/20/2013 [facility] Patient Information 2015 [place], [place]. This information is not intended to replace advice given to you by your health care provider. Make sure you discuss any questions you have with your health care provider.

## 2014-05-30 NOTE — ED Provider Notes (Signed)
CSN: [id number]     Arrival date & time 05/30/14  1304 History   First MD Initiated Contact with Patient 05/30/14 1311     Chief Complaint  Patient presents with  . Irregular Heart Beat     (Consider location/radiation/quality/duration/timing/severity/associated sxs/prior Treatment) Patient is a 43 y.o. female presenting with URI. The history is provided by the patient.  URI Presenting symptoms: congestion, rhinorrhea and sore throat   Presenting symptoms: no cough and no fever   Congestion:    Location:  Nasal   Interferes with sleep: no   Rhinorrhea:    Quality:  Clear   Severity:  Moderate   Timing:  Constant   Progression:  Unchanged   History reviewed. No pertinent past medical history. Past Surgical History  Procedure Laterality Date  . Ectopic pregnancy surgery     No family history on file. History  Substance Use Topics  . Smoking status: Former Games developermoker  . Smokeless tobacco: Not on file  . Alcohol Use: No   OB History   Grav Para Term Preterm Abortions TAB SAB Ect Mult Living                 Review of Systems  Constitutional: Negative for fever and chills.  HENT: Positive for congestion, rhinorrhea, sinus pressure and sore throat.   Respiratory: Negative for cough and shortness of breath.   Cardiovascular: Negative for chest pain.  Gastrointestinal: Negative for vomiting and abdominal pain.  All other systems reviewed and are negative.     Allergies  Review of patient's allergies indicates no known allergies.  Home Medications   Prior to Admission medications   Medication Sig Start Date End Date Taking? Authorizing Provider  metroNIDAZOLE (FLAGYL) 500 MG tablet Take 1 tablet (500 mg total) by mouth 2 (two) times daily. 04/07/14   Vanetta MuldersScott Zackowski, MD   BP 159/70  Pulse 120  Temp(Src) 98.2 F (36.8 C) (Oral)  Resp 20  Ht 5\' 4"  (1.626 m)  Wt 91 lb (41.277 kg)  BMI 15.61 kg/m2  SpO2 100% Physical Exam  Nursing note and vitals  reviewed. Constitutional: She is oriented to person, place, and time. She appears well-developed and well-nourished. No distress.  HENT:  Head: Normocephalic and atraumatic.  Mouth/Throat: Oropharynx is clear and moist.  Eyes: EOM are normal. Pupils are equal, round, and reactive to light.  Neck: Normal range of motion. Neck supple.  Cardiovascular: Regular rhythm.  Tachycardia present.  Exam reveals no friction rub.   No murmur heard. Pulmonary/Chest: Effort normal and breath sounds normal. No respiratory distress. She has no wheezes. She has no rales.  Abdominal: Soft. She exhibits no distension. There is no tenderness. There is no rebound.  Musculoskeletal: Normal range of motion. She exhibits no edema.  Neurological: She is alert and oriented to person, place, and time.  Skin: No rash noted. She is not diaphoretic.    ED Course  Procedures (including critical care time) Labs Review Labs Reviewed  GC/CHLAMYDIA PROBE AMP  WET PREP, GENITAL  CBC  COMPREHENSIVE METABOLIC PANEL  URINALYSIS, ROUTINE W REFLEX MICROSCOPIC  I-STAT CG4 LACTIC ACID, ED    Imaging Review No results found.   EKG Interpretation   Date/Time:  Friday May 30 2014 13:17:22 EDT Ventricular Rate:  146 PR Interval:  62 QRS Duration: 62 QT Interval:  332 QTC Calculation: 517 R Axis:   62 Text Interpretation:  Sinus tachycardia with short PR Nonspecific ST and T  wave abnormality Abnormal ECG No prior  Confirmed by Gwendolyn GrantWALDEN  MD, Dyllon Henken  ([id number]) on 05/30/2014 1:19:54 PM      MDM   Final diagnoses:  Dehydration  BV (bacterial vaginosis)    61F here with concerns for dehydration. No fevers. URI symptoms for 1 week with decreased PO intake. Sinus congestion, nasal congestion, sore throat present. No vomiting or diarrhea.  No abdominal pain. EKG with HR in the 140s, intermittently tachycardic up to the 130s when I am in the room, always sinus. Will check labs, give fluids. Patient also is concerned  she has BV, will do pelvic. HRs improving, labs ok. Wet prep shows BV, will terat with flagyl. Likely source of contaminate for UA, is not having any urinary symptoms.  Elwin MochaBlair Niya Behler, MD 05/30/14 [phone]

## 2014-05-30 NOTE — ED Notes (Signed)
Patient states this morning she developed an irregular heartbeat around 1000 am.  States she has had the same thing in the past when she was dehydrated.

## 2014-05-31 LAB — GC/CHLAMYDIA PROBE AMP
CT Probe RNA: NEGATIVE
GC Probe RNA: NEGATIVE

## 2014-07-08 ENCOUNTER — Emergency Department (HOSPITAL_BASED_OUTPATIENT_CLINIC_OR_DEPARTMENT_OTHER)
Admission: EM | Admit: 2014-07-08 | Discharge: 2014-07-08 | Disposition: A | Discharge status: Home / Self Care | Payer: Medicaid Other | Attending: Emergency Medicine | Admitting: Emergency Medicine

## 2014-07-08 ENCOUNTER — Encounter (HOSPITAL_BASED_OUTPATIENT_CLINIC_OR_DEPARTMENT_OTHER): Payer: Self-pay | Admitting: Emergency Medicine

## 2014-07-08 DIAGNOSIS — Z87891 Personal history of nicotine dependence: Secondary | ICD-10-CM | POA: Insufficient documentation

## 2014-07-08 DIAGNOSIS — Z3202 Encounter for pregnancy test, result negative: Secondary | ICD-10-CM | POA: Diagnosis not present

## 2014-07-08 DIAGNOSIS — R000 Tachycardia, unspecified: Secondary | ICD-10-CM | POA: Diagnosis not present

## 2014-07-08 DIAGNOSIS — F419 Anxiety disorder, unspecified: Secondary | ICD-10-CM | POA: Insufficient documentation

## 2014-07-08 DIAGNOSIS — Z792 Long term (current) use of antibiotics: Secondary | ICD-10-CM | POA: Diagnosis not present

## 2014-07-08 DIAGNOSIS — R002 Palpitations: Secondary | ICD-10-CM | POA: Diagnosis present

## 2014-07-08 LAB — BASIC METABOLIC PANEL
Anion gap: 14 (ref 5–15)
BUN: 10 mg/dL (ref 6–23)
CO2: 26 mEq/L (ref 19–32)
CREATININE: 0.5 mg/dL (ref 0.50–1.10)
Calcium: 9.9 mg/dL (ref 8.4–10.5)
Chloride: 101 mEq/L (ref 96–112)
Glucose, Bld: 117 mg/dL — ABNORMAL HIGH (ref 70–99)
Potassium: 4.7 mEq/L (ref 3.7–5.3)
Sodium: 141 mEq/L (ref 137–147)

## 2014-07-08 LAB — CBC
HEMATOCRIT: 35.1 % — AB (ref 36.0–46.0)
Hemoglobin: 11.9 g/dL — ABNORMAL LOW (ref 12.0–15.0)
MCH: 30.7 pg (ref 26.0–34.0)
MCHC: 33.9 g/dL (ref 30.0–36.0)
MCV: 90.7 fL (ref 78.0–100.0)
Platelets: 190 10*3/uL (ref 150–400)
RBC: 3.87 MIL/uL (ref 3.87–5.11)
RDW: 12.1 % (ref 11.5–15.5)
WBC: 5.4 10*3/uL (ref 4.0–10.5)

## 2014-07-08 LAB — URINALYSIS, ROUTINE W REFLEX MICROSCOPIC
Bilirubin Urine: NEGATIVE
Glucose, UA: NEGATIVE mg/dL
HGB URINE DIPSTICK: NEGATIVE
Ketones, ur: NEGATIVE mg/dL
Nitrite: NEGATIVE
PH: 7 (ref 5.0–8.0)
Protein, ur: NEGATIVE mg/dL
SPECIFIC GRAVITY, URINE: 1.017 (ref 1.005–1.030)
UROBILINOGEN UA: 1 mg/dL (ref 0.0–1.0)

## 2014-07-08 LAB — PREGNANCY, URINE: Preg Test, Ur: NEGATIVE

## 2014-07-08 LAB — URINE MICROSCOPIC-ADD ON

## 2014-07-08 LAB — TSH: TSH: 2.15 u[IU]/mL (ref 0.350–4.500)

## 2014-07-08 LAB — TROPONIN I: Troponin I: <0.3 ng/mL (ref <0.30)

## 2014-07-08 LAB — T4, FREE: FREE T4: 1.06 ng/dL (ref 0.80–1.80)

## 2014-07-08 MED ORDER — SODIUM CHLORIDE 0.9 % IV BOLUS (SEPSIS)
1000.0000 mL | Freq: Once | INTRAVENOUS | Status: AC
  Administered 2014-07-08: 1000 mL via INTRAVENOUS

## 2014-07-08 NOTE — Discharge Instructions (Signed)
Nonspecific Tachycardia  Tachycardia is a faster than normal heartbeat (more than 100 beats per minute). In adults, the heart normally beats between 60 and 100 times a minute. A fast heartbeat may be a normal response to exercise or stress. It does not necessarily mean that something is wrong. However, sometimes when your heart beats too fast it may not be able to pump enough blood to the rest of your body. This can result in chest pain, shortness of breath, dizziness, and even fainting. Nonspecific tachycardia means that the specific cause or pattern of your tachycardia is unknown.  CAUSES   Tachycardia may be harmless or it may be due to a more serious underlying cause. Possible causes of tachycardia include:  · Exercise or exertion.  · Fever.  · Pain or injury.  · Infection.  · Loss of body fluids (dehydration).  · Overactive thyroid.  · Lack of red blood cells (anemia).  · Anxiety and stress.  · Alcohol.  · Caffeine.  · Tobacco products.  · Diet pills.  · Illegal drugs.  · Heart disease.  SYMPTOMS  · Rapid or irregular heartbeat (palpitations).  · Suddenly feeling your heart beating (cardiac awareness).  · Dizziness.  · Tiredness (fatigue).  · Shortness of breath.  · Chest pain.  · Nausea.  · Fainting.  DIAGNOSIS   Your caregiver will perform a physical exam and take your medical history. In some cases, a heart specialist (cardiologist) may be consulted. Your caregiver may also order:  · Blood tests.  · Electrocardiography. This test records the electrical activity of your heart.  · A heart monitoring test.  TREATMENT   Treatment will depend on the likely cause of your tachycardia. The goal is to treat the underlying cause of your tachycardia. Treatment methods may include:  · Replacement of fluids or blood through an intravenous (IV) tube for moderate to severe dehydration or anemia.  · New medicines or changes in your current medicines.  · Diet and lifestyle changes.  · Treatment for certain  infections.  · Stress relief or relaxation methods.  HOME CARE INSTRUCTIONS   · Rest.  · Drink enough fluids to keep your urine clear or pale yellow.  · Do not smoke.  · Avoid:  ¨ Caffeine.  ¨ Tobacco.  ¨ Alcohol.  ¨ Chocolate.  ¨ Stimulants such as over-the-counter diet pills or pills that help you stay awake.  ¨ Situations that cause anxiety or stress.  ¨ Illegal drugs such as marijuana, phencyclidine (PCP), and cocaine.  · Only take medicine as directed by your caregiver.  · Keep all follow-up appointments as directed by your caregiver.  SEEK IMMEDIATE MEDICAL CARE IF:   · You have pain in your chest, upper arms, jaw, or neck.  · You become weak, dizzy, or feel faint.  · You have palpitations that will not go away.  · You vomit, have diarrhea, or pass blood in your stool.  · Your skin is cool, pale, and wet.  · You have a fever that will not go away with rest, fluids, and medicine.  MAKE SURE YOU:   · Understand these instructions.  · Will watch your condition.  · Will get help right away if you are not doing well or get worse.  Document Released: 09/15/2004 Document Revised: 10/31/2011 Document Reviewed: 07/19/2011  [employer]® Patient Information ©2015 [employer]. This information is not intended to replace advice given to you by your health care provider. Make sure you discuss any questions   you have with your health care provider.

## 2014-07-08 NOTE — ED Provider Notes (Signed)
CSN: [id number]     Arrival date & time 07/08/14  1928 History  This chart was scribed for Brittany MoMatthew Gentry, MD by Evon Slackerrance Branch, ED Scribe. This patient was seen in room MH05/MH05 and the patient's care was started at 8:10 PM.    No chief complaint on file.  Patient is a 43 y.o. female presenting with palpitations. The history is provided by the patient. No language interpreter was used.  Palpitations Palpitations quality:  Irregular Progression:  Unchanged Chronicity:  New Relieved by:  None tried Worsened by:  Nothing tried Ineffective treatments:  None tried Associated symptoms: no chest pain, no nausea, no shortness of breath and no vomiting    HPI Comments: Brittany Mann is a 43 y.o. female who presents to the Emergency Department complaining of palpitations onset today. Pt states she went to her PCP today and was told to come to ED due to fast heartbeat. She states she has recently been having high blood pressure over the past 5 days. States she has decreased appetite. PT states she feels fine but states she feels a little nervous and was light headed earlier today but has now resolved.  Denies fevers, nausea, vomiting, diarrhea, constipation, CP, difficulty breathing or abdominal pain.    History reviewed. No pertinent past medical history. Past Surgical History  Procedure Laterality Date  . Ectopic pregnancy surgery     History reviewed. No pertinent family history. History  Substance Use Topics  . Smoking status: Former Games developermoker  . Smokeless tobacco: Not on file  . Alcohol Use: No   OB History    No data available     Review of Systems  Constitutional: Negative for fever.  Respiratory: Negative for shortness of breath.   Cardiovascular: Positive for palpitations. Negative for chest pain.  Gastrointestinal: Negative for nausea, vomiting and anal bleeding.  Neurological: Positive for light-headedness.  Psychiatric/Behavioral: The patient is nervous/anxious.   All  other systems reviewed and are negative.   Allergies  Review of patient's allergies indicates no known allergies.  Home Medications   Prior to Admission medications   Medication Sig Start Date End Date Taking? Authorizing Provider  metroNIDAZOLE (FLAGYL) 500 MG tablet Take 1 tablet (500 mg total) by mouth 2 (two) times daily. 04/07/14   Vanetta MuldersScott Zackowski, MD  metroNIDAZOLE (FLAGYL) 500 MG tablet Take 1 tablet (500 mg total) by mouth 2 (two) times daily. One po bid x 7 days 05/30/14   Elwin MochaBlair Walden, MD  metroNIDAZOLE (METROGEL) 1 % gel Apply topically daily. For 7 days 05/30/14   Elwin MochaBlair Walden, MD   Triage Vitals: BP 133/68 mmHg  Pulse 120  Temp(Src) 98.8 F (37.1 C) (Oral)  Resp 18  SpO2 100%  Physical Exam  Constitutional: She is oriented to person, place, and time. She appears well-developed and well-nourished. No distress.  HENT:  Head: Normocephalic and atraumatic.  Right Ear: External ear normal.  Left Ear: External ear normal.  Eyes: Conjunctivae and EOM are normal. Pupils are equal, round, and reactive to light.  Neck: Normal range of motion. Neck supple. No tracheal deviation present.  Cardiovascular: Regular rhythm, normal heart sounds and intact distal pulses.  Tachycardia present.   Pulmonary/Chest: Effort normal and breath sounds normal. No respiratory distress.  Abdominal: Soft. Bowel sounds are normal. There is no tenderness.  Musculoskeletal: Normal range of motion.  Neurological: She is alert and oriented to person, place, and time.  Skin: Skin is warm and dry.  Psychiatric: She has a normal mood and  affect. Her behavior is normal.  Nursing note and vitals reviewed.   ED Course  Procedures (including critical care time) DIAGNOSTIC STUDIES: Oxygen Saturation is 100% on RA, normal by my interpretation.    COORDINATION OF CARE:    Labs Review Labs Reviewed  CBC - Abnormal; Notable for the following:    Hemoglobin 11.9 (*)    HCT 35.1 (*)    All other  components within normal limits  BASIC METABOLIC PANEL - Abnormal; Notable for the following:    Glucose, Bld 117 (*)    All other components within normal limits  URINALYSIS, ROUTINE W REFLEX MICROSCOPIC - Abnormal; Notable for the following:    Leukocytes, UA SMALL (*)    All other components within normal limits  TROPONIN I  T4, FREE  PREGNANCY, URINE  URINE MICROSCOPIC-ADD ON  TSH    Imaging Review No results found.   EKG Interpretation   Date/Time:  Tuesday July 08 2014 20:03:02 EST Ventricular Rate:  111 PR Interval:  130 QRS Duration: 70 QT Interval:  314 QTC Calculation: 427 R Axis:   64 Text Interpretation:  Sinus tachycardia Biatrial enlargement Nonspecific  ST and T wave abnormality Abnormal ECG No significant change since last  tracing Confirmed by Brittany MoGentry, Matthew [id number] on 07/08/2014 8:21:47 PM      MDM   Final diagnoses:  Tachycardia       [age] y.o. female without pertinent PMH presents with recurrent tachycardia.  Pt has been seen in the past for the same symptoms.  During prior examination her symptoms resolved with fluid. Patient states that she has felt generally malaise, however otherwise denies chest pain, dyspnea, other systemic symptoms. No GI symptoms reported. She went to see her primary care doctor for follow-up today and was found to be tachycardic.  On arrival today vitals signs and physical exam as above. Patient then, endorses decreased by mouth intake.  After 1 L of normal saline the patient's heart rate improved from 130 to 80. Obtained lab work which was unremarkable. EKG with sinus tachycardia, no change from previous. Likely sinus tachycardia secondary to dehydration and poor oral intake. Discussed with patient importance of primary care doctor follow-up for weight loss. Standard return precautions given. She was understanding, agreed with plan, and agreed to follow-up.    1. Tachycardia           Brittany MoMatthew Gentry, MD 07/08/14  2326

## 2014-07-08 NOTE — ED Notes (Signed)
Pt states that she went to doctors today and they stated she had an irregular heartbeat and told her to come here.

## 2015-12-30 ENCOUNTER — Encounter (HOSPITAL_BASED_OUTPATIENT_CLINIC_OR_DEPARTMENT_OTHER): Payer: Self-pay | Admitting: *Deleted

## 2015-12-30 ENCOUNTER — Emergency Department (HOSPITAL_BASED_OUTPATIENT_CLINIC_OR_DEPARTMENT_OTHER): Payer: Medicaid Other

## 2015-12-30 ENCOUNTER — Emergency Department (HOSPITAL_BASED_OUTPATIENT_CLINIC_OR_DEPARTMENT_OTHER)
Admission: EM | Admit: 2015-12-30 | Discharge: 2015-12-30 | Disposition: A | Discharge status: Home / Self Care | Payer: Medicaid Other | Attending: Emergency Medicine | Admitting: Emergency Medicine

## 2015-12-30 DIAGNOSIS — J301 Allergic rhinitis due to pollen: Secondary | ICD-10-CM | POA: Insufficient documentation

## 2015-12-30 DIAGNOSIS — J329 Chronic sinusitis, unspecified: Secondary | ICD-10-CM | POA: Diagnosis not present

## 2015-12-30 DIAGNOSIS — Z87891 Personal history of nicotine dependence: Secondary | ICD-10-CM | POA: Diagnosis not present

## 2015-12-30 DIAGNOSIS — R0981 Nasal congestion: Secondary | ICD-10-CM | POA: Diagnosis present

## 2015-12-30 DIAGNOSIS — J302 Other seasonal allergic rhinitis: Secondary | ICD-10-CM

## 2015-12-30 DIAGNOSIS — J321 Chronic frontal sinusitis: Secondary | ICD-10-CM

## 2015-12-30 LAB — BASIC METABOLIC PANEL
Anion gap: 5 (ref 5–15)
BUN: 11 mg/dL (ref 6–20)
CALCIUM: 9.3 mg/dL (ref 8.9–10.3)
CO2: 26 mmol/L (ref 22–32)
Chloride: 107 mmol/L (ref 101–111)
Creatinine, Ser: 0.51 mg/dL (ref 0.44–1.00)
GFR calc non Af Amer: >60 mL/min (ref >60)
GLUCOSE: 97 mg/dL (ref 65–99)
Potassium: 3.6 mmol/L (ref 3.5–5.1)
SODIUM: 138 mmol/L (ref 135–145)

## 2015-12-30 LAB — CBC WITH DIFFERENTIAL/PLATELET
BASOS PCT: 0 %
Basophils Absolute: 0 10*3/uL (ref 0.0–0.1)
Eosinophils Absolute: 0 10*3/uL (ref 0.0–0.7)
Eosinophils Relative: 0 %
HCT: 32.3 % — ABNORMAL LOW (ref 36.0–46.0)
Hemoglobin: 11 g/dL — ABNORMAL LOW (ref 12.0–15.0)
Lymphocytes Relative: 19 %
Lymphs Abs: 0.7 10*3/uL (ref 0.7–4.0)
MCH: 30.5 pg (ref 26.0–34.0)
MCHC: 34.1 g/dL (ref 30.0–36.0)
MCV: 89.5 fL (ref 78.0–100.0)
MONO ABS: 0.2 10*3/uL (ref 0.1–1.0)
MONOS PCT: 4 %
NEUTROS PCT: 77 %
Neutro Abs: 3 10*3/uL (ref 1.7–7.7)
Platelets: 166 10*3/uL (ref 150–400)
RBC: 3.61 MIL/uL — ABNORMAL LOW (ref 3.87–5.11)
RDW: 12.4 % (ref 11.5–15.5)
WBC: 3.9 10*3/uL — ABNORMAL LOW (ref 4.0–10.5)

## 2015-12-30 MED ORDER — SODIUM CHLORIDE 0.9 % IV SOLN: INTRAVENOUS | Status: DC

## 2015-12-30 MED ORDER — SODIUM CHLORIDE 0.9 % IV BOLUS (SEPSIS)
1000.0000 mL | Freq: Once | INTRAVENOUS | Status: AC
  Administered 2015-12-30: 1000 mL via INTRAVENOUS

## 2015-12-30 MED ORDER — CETIRIZINE HCL 10 MG PO CAPS: 10.0000 mg | ORAL_CAPSULE | Freq: Every day | ORAL | Status: AC

## 2015-12-30 MED ORDER — NAPROXEN 500 MG PO TABS: 500.0000 mg | ORAL_TABLET | Freq: Two times a day (BID) | ORAL | Status: AC

## 2015-12-30 MED FILL — ALL DAY ALLERGY 10 MG TAB: 10 | 10 days supply | Qty: 10 | Fill #0

## 2015-12-30 MED FILL — NAPROXEN 500 MG TABLET: 500 | 7 days supply | Qty: 14 | Fill #0

## 2015-12-30 NOTE — ED Notes (Signed)
Patient transported to CT 

## 2015-12-30 NOTE — ED Notes (Signed)
Pt reports nasal congestion x 2 weeks, sinus pressure and facial pain/head aching x last night. Pt states she has not taken any otc medications. Denies cough or other c/o.

## 2015-12-30 NOTE — ED Notes (Signed)
PT REFUSES CT SCAN

## 2015-12-30 NOTE — ED Provider Notes (Signed)
CSN: [id number]     Arrival date & time 12/30/15  1004 History   First MD Initiated Contact with Patient 12/30/15 1021     Chief Complaint  Patient presents with  . Nasal Congestion     (Consider location/radiation/quality/duration/timing/severity/associated sxs/prior Treatment) The history is provided by the patient.  45 year old female presents with the complaint of frontal sinus pressure and congestion got worse last night. Associated with a headache that is 8 out of 10. Patient's had some sinus pressure for the past 2 weeks. Patient sounds as if she has a history of seasonal allergies usually has trouble this time a year and in the fall. Patient is not on an antihistamine. Patient feels that she's dehydrated. That's a common complaint when she comes in despite her actual complaint being different and varied. Patient states nausea but no vomiting. Patient denies fevers.  History reviewed. No pertinent past medical history. Past Surgical History  Procedure Laterality Date  . Ectopic pregnancy surgery     History reviewed. No pertinent family history. Social History  Substance Use Topics  . Smoking status: Former Games developermoker  . Smokeless tobacco: None  . Alcohol Use: No   OB History    No data available     Review of Systems  Constitutional: Negative for fever.  HENT: Positive for congestion and sinus pressure.   Eyes: Negative for redness and itching.  Respiratory: Negative for shortness of breath.   Cardiovascular: Negative for chest pain.  Gastrointestinal: Positive for nausea. Negative for vomiting and abdominal pain.  Genitourinary: Negative for dysuria.  Musculoskeletal: Negative for back pain and neck pain.  Skin: Negative for rash.  Neurological: Positive for headaches.  Hematological: Does not bruise/bleed easily.  Psychiatric/Behavioral: Negative for confusion.      Allergies  Review of patient's allergies indicates no known allergies.  Home Medications    Prior to Admission medications   Medication Sig Start Date End Date Taking? Authorizing Provider  Cetirizine HCl 10 MG CAPS Take 1 capsule (10 mg total) by mouth daily. 12/30/15   Vanetta MuldersScott Osama Coleson, MD  naproxen (NAPROSYN) 500 MG tablet Take 1 tablet (500 mg total) by mouth 2 (two) times daily. 12/30/15   Vanetta MuldersScott Coulton Schlink, MD   BP 138/74 mmHg  Pulse 89  Temp(Src) 98 F (36.7 C) (Oral)  Resp 18  SpO2 98% Physical Exam  Constitutional: She is oriented to person, place, and time. She appears well-developed and well-nourished. No distress.  HENT:  Head: Normocephalic and atraumatic.  Mouth/Throat: Oropharynx is clear and moist.  Tenderness to palpation over the frontal sinuses right greater than left.  Eyes: Conjunctivae and EOM are normal. Pupils are equal, round, and reactive to light.  Neck: Normal range of motion. Neck supple.  Cardiovascular: Normal rate, regular rhythm and normal heart sounds.   No murmur heard. Pulmonary/Chest: Effort normal and breath sounds normal. No respiratory distress.  Abdominal: Soft. Bowel sounds are normal. There is no tenderness.  Musculoskeletal: Normal range of motion. She exhibits no edema.  Neurological: She is alert and oriented to person, place, and time. No cranial nerve deficit. She exhibits normal muscle tone. Coordination normal.  Skin: No rash noted.  Nursing note and vitals reviewed.   ED Course  Procedures (including critical care time) Labs Review Labs Reviewed  CBC WITH DIFFERENTIAL/PLATELET - Abnormal; Notable for the following:    WBC 3.9 (*)    RBC 3.61 (*)    Hemoglobin 11.0 (*)    HCT 32.3 (*)    All  other components within normal limits  BASIC METABOLIC PANEL   Results for orders placed or performed during the hospital encounter of 12/30/15  Basic metabolic panel  Result Value Ref Range   Sodium 138 135 - 145 mmol/L   Potassium 3.6 3.5 - 5.1 mmol/L   Chloride 107 101 - 111 mmol/L   CO2 26 22 - 32 mmol/L   Glucose, Bld  97 65 - 99 mg/dL   BUN 11 6 - 20 mg/dL   Creatinine, Ser 4.09 0.44 - 1.00 mg/dL   Calcium 9.3 8.9 - 81.1 mg/dL   GFR calc non Af Amer >60 >60 mL/min   GFR calc Af Amer >60 >60 mL/min   Anion gap 5 5 - 15  CBC with Differential/Platelet  Result Value Ref Range   WBC 3.9 (L) 4.0 - 10.5 K/uL   RBC 3.61 (L) 3.87 - 5.11 MIL/uL   Hemoglobin 11.0 (L) 12.0 - 15.0 g/dL   HCT 91.4 (L) 78.2 - 95.6 %   MCV 89.5 78.0 - 100.0 fL   MCH 30.5 26.0 - 34.0 pg   MCHC 34.1 30.0 - 36.0 g/dL   RDW 21.3 08.6 - 57.8 %   Platelets 166 150 - 400 K/uL   Neutrophils Relative % 77 %   Neutro Abs 3.0 1.7 - 7.7 K/uL   Lymphocytes Relative 19 %   Lymphs Abs 0.7 0.7 - 4.0 K/uL   Monocytes Relative 4 %   Monocytes Absolute 0.2 0.1 - 1.0 K/uL   Eosinophils Relative 0 %   Eosinophils Absolute 0.0 0.0 - 0.7 K/uL   Basophils Relative 0 %   Basophils Absolute 0.0 0.0 - 0.1 K/uL    Imaging Review No results found. I have personally reviewed and evaluated these images and lab results as part of my medical decision-making.  Patient refused head CT.   EKG Interpretation None         MDM   Final diagnoses:  Frontal sinusitis, unspecified chronicity  Seasonal allergies    Suspect the patient's symptoms are related to seasonal allergies with some frontal sinusitis. Agent not febrile no leukocytosis. Will treat symptomatically with a antihistamine and Naprosyn. Patient nontoxic no acute distress. Labs showed no evidence of any significant dehydration. Patient's always concerned about that.    Vanetta Mulders, MD 12/30/15 [phone]

## 2015-12-30 NOTE — Discharge Instructions (Signed)
Suspect the pain is due to frontal sinusitis. May very well be related to seasonal allergies. Take the Zyrtec as directed and give it a try for a week. Take the Naprosyn as needed for headache and pain. Make an appointment to follow-up with your regular doctor.

## 2015-12-30 NOTE — ED Notes (Signed)
Presents today with sinus pressure and having nausea. Awoke with HA this am, having sinus pressure for the past 3 weeks

## 2016-09-11 ENCOUNTER — Encounter (HOSPITAL_BASED_OUTPATIENT_CLINIC_OR_DEPARTMENT_OTHER): Payer: Self-pay | Admitting: Emergency Medicine

## 2016-09-11 ENCOUNTER — Emergency Department (HOSPITAL_BASED_OUTPATIENT_CLINIC_OR_DEPARTMENT_OTHER)
Admission: EM | Admit: 2016-09-11 | Discharge: 2016-09-11 | Disposition: A | Discharge status: Home / Self Care | Payer: Medicaid Other | Attending: Emergency Medicine | Admitting: Emergency Medicine

## 2016-09-11 DIAGNOSIS — R350 Frequency of micturition: Secondary | ICD-10-CM | POA: Diagnosis not present

## 2016-09-11 DIAGNOSIS — Z87891 Personal history of nicotine dependence: Secondary | ICD-10-CM | POA: Insufficient documentation

## 2016-09-11 DIAGNOSIS — J020 Streptococcal pharyngitis: Secondary | ICD-10-CM | POA: Diagnosis not present

## 2016-09-11 DIAGNOSIS — J029 Acute pharyngitis, unspecified: Secondary | ICD-10-CM | POA: Diagnosis present

## 2016-09-11 LAB — URINALYSIS, ROUTINE W REFLEX MICROSCOPIC
Bilirubin Urine: NEGATIVE
Glucose, UA: NEGATIVE mg/dL
HGB URINE DIPSTICK: NEGATIVE
Ketones, ur: NEGATIVE mg/dL
Nitrite: NEGATIVE
Protein, ur: NEGATIVE mg/dL
Specific Gravity, Urine: 1.024 (ref 1.005–1.030)
pH: 6.5 (ref 5.0–8.0)

## 2016-09-11 LAB — RAPID STREP SCREEN (MED CTR MEBANE ONLY): Streptococcus, Group A Screen (Direct): POSITIVE — AB

## 2016-09-11 LAB — URINALYSIS, MICROSCOPIC (REFLEX): RBC / HPF: NONE SEEN RBC/hpf (ref 0–5)

## 2016-09-11 MED ORDER — ACETAMINOPHEN 325 MG PO TABS: 650.0000 mg | ORAL_TABLET | Freq: Once | ORAL | Status: DC | PRN

## 2016-09-11 MED ORDER — PENICILLIN V POTASSIUM 500 MG PO TABS: 500.0000 mg | ORAL_TABLET | Freq: Two times a day (BID) | ORAL | 0 refills | Status: AC

## 2016-09-11 MED ORDER — ACETAMINOPHEN 160 MG/5ML PO SOLN
650.0000 mg | Freq: Once | ORAL | Status: AC
  Administered 2016-09-11: 650 mg via ORAL
  Filled 2016-09-11: qty 20.3

## 2016-09-11 NOTE — ED Triage Notes (Addendum)
Patient c/o sore throat, fever and chills at home, temp 102.1 on arrival to ER. Patient has not treated her symptoms at home.   Patient states she believes she is dehydrated and needs fluids.   Also c/o urinary frequency and would like to be checked for UTI.

## 2016-09-11 NOTE — ED Provider Notes (Signed)
[facility] Provider Note   CSN: [id number] Arrival date & time: 09/11/16  1702  By signing my name below, I, Rosario AdieWilliam Andrew Hiatt, attest that this documentation has been prepared under the direction and in the presence of Rolan BuccoMelanie Rmoni Keplinger, MD. Electronically Signed: Rosario AdieWilliam Andrew Hiatt, ED Scribe. 09/11/16. 7:33 PM.  History   Chief Complaint Chief Complaint  Patient presents with  . Sore Throat  . Urinary Frequency   The history is provided by the patient. No language interpreter was used.    HPI Comments: Brittany Mann is a 46 y.o. female with no pertinent PMHx, who presents to the Emergency Department complaining of persistent, gradually worsening sore throat beginning this morning. She reports associated frontal headache, nasal congestion, sinus pressure, subjective fever, chills, and urinary frequency. Pt is able to tolerate their own secretions, but pain is exacerbated w/ swallowing. She has been taking Tylenol at home which she states moderately relived her sore throat. 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 She denies difficulty swallowing, nausea, vomiting, rash, or any other associated symptoms.   History reviewed. No pertinent past medical history.  There are no active problems to display for this patient.  Past Surgical History:  Procedure Laterality Date  . ECTOPIC PREGNANCY SURGERY     OB History    No data available     Home Medications    Prior to Admission medications   Medication Sig Start Date End Date Taking? Authorizing Provider  Cetirizine HCl 10 MG CAPS Take 1 capsule (10 mg total) by mouth daily. 12/30/15   Vanetta MuldersScott Zackowski, MD  naproxen (NAPROSYN) 500 MG tablet Take 1 tablet (500 mg total) by mouth 2 (two) times daily. 12/30/15   Vanetta MuldersScott Zackowski, MD  penicillin v potassium (VEETID) 500 MG tablet Take 1 tablet (500 mg total) by mouth 2 (two) times daily. 09/11/16   Rolan BuccoMelanie Maddix Heinz, MD   Family  History No family history on file.  Social History Social History  Substance Use Topics  . Smoking status: Former Games developermoker  . Smokeless tobacco: Not on file  . Alcohol use No   Allergies   Shellfish allergy  Review of Systems Review of Systems  Constitutional: Positive for chills and fever (subjective). Negative for diaphoresis and fatigue.  HENT: Positive for congestion, sinus pressure and sore throat. Negative for rhinorrhea, sneezing and trouble swallowing.   Eyes: Negative.   Respiratory: Negative for cough, chest tightness and shortness of breath.   Cardiovascular: Negative for chest pain and leg swelling.  Gastrointestinal: Negative for abdominal pain, blood in stool, diarrhea, nausea and vomiting.  Genitourinary: Positive for frequency. Negative for difficulty urinating, flank pain, hematuria, vaginal bleeding and vaginal discharge.  Musculoskeletal: Negative for arthralgias and back pain.  Skin: Negative for rash.  Neurological: Positive for headaches. Negative for dizziness, speech difficulty, weakness and numbness.  All other systems reviewed and are negative.  Physical Exam Updated Vital Signs BP 137/83 (BP Location: Right Arm)   Pulse 117   Temp 99.4 F (37.4 C) (Oral)   Resp 18   Ht 5\' 4"  (1.626 m)   Wt 95 lb (43.1 kg)   SpO2 98%   BMI 16.31 kg/m   Physical Exam  Constitutional: She is oriented to person, place, and time. She appears well-developed and well-nourished.  HENT:  Head: Normocephalic and atraumatic.  Mild erythema to the posterior pharynx. Uvula is midline. No trismus.   Eyes: Pupils are equal, round, and  reactive to light.  Neck: Normal range of motion. Neck supple.  Cardiovascular: Normal rate, regular rhythm and normal heart sounds.   Pulmonary/Chest: Effort normal and breath sounds normal. No respiratory distress. She has no wheezes. She has no rales. She exhibits no tenderness.  Abdominal: Soft. Bowel sounds are normal. There is no  tenderness. There is no rebound and no guarding.  Musculoskeletal: Normal range of motion. She exhibits no edema.  Lymphadenopathy:    She has no cervical adenopathy.  Neurological: She is alert and oriented to person, place, and time.  Skin: Skin is warm and dry. No rash noted.  Psychiatric: She has a normal mood and affect.   ED Treatments / Results  DIAGNOSTIC STUDIES: Oxygen Saturation is 98% on RA, normal by my interpretation.   COORDINATION OF CARE: 7:32 PM-Discussed next steps with pt including rapid strep and UA. Pt verbalized understanding and is agreeable with the plan.   Labs (all labs ordered are listed, but only abnormal results are displayed) Labs Reviewed  RAPID STREP SCREEN (NOT AT [facility]) - Abnormal; Notable for the following:       Result Value   Streptococcus, Group A Screen (Direct) POSITIVE (*)    All other components within normal limits  URINALYSIS, ROUTINE W REFLEX MICROSCOPIC - Abnormal; Notable for the following:    Leukocytes, UA SMALL (*)    All other components within normal limits  URINALYSIS, MICROSCOPIC (REFLEX) - Abnormal; Notable for the following:    Bacteria, UA RARE (*)    Squamous Epithelial / LPF 0-5 (*)    All other components within normal limits   EKG  EKG Interpretation None      Radiology No results found.  Procedures Procedures   Medications Ordered in ED Medications  acetaminophen (TYLENOL) solution 650 mg (650 mg Oral Given 09/11/16 1754)   Initial Impression / Assessment and Plan / ED Course  I have reviewed the triage vital signs and the nursing notes.  Pertinent labs & imaging results that were available during my care of the patient were reviewed by me and considered in my medical decision making (see chart for details).     Patient presents with nasal congestion and sore throat. Her strep test is positive. She has no clinical signs of a peritonsillar abscess. She's otherwise well-appearing. Her lungs are clear  without suggestions of pneumonia. She has some urinary frequency but no evidence of a UTI. She was discharged home in good condition. She was given a prescription for penicillin. She was encouraged to follow-up with her PCP if her symptoms are not improving.  Final Clinical Impressions(s) / ED Diagnoses   Final diagnoses:  Strep pharyngitis   New Prescriptions New Prescriptions   PENICILLIN V POTASSIUM (VEETID) 500 MG TABLET    Take 1 tablet (500 mg total) by mouth 2 (two) times daily.   I personally performed the services described in this documentation, which was scribed in my presence.  The recorded information has been reviewed and considered.     Rolan Bucco, MD 09/11/16 [phone]

## 2016-09-11 NOTE — ED Notes (Signed)
Pt states she did take all the APAP that the triage RN gave her. Pt's Temp now is 99.4.

## 2016-09-11 NOTE — ED Triage Notes (Signed)
Patient did not take all of the tylenol that was given to her. When this nurse turned her back the patient threw the remaining medication into the trash can. Unable to quantify the amount the patient discarded.

## 2017-03-19 ENCOUNTER — Encounter (HOSPITAL_BASED_OUTPATIENT_CLINIC_OR_DEPARTMENT_OTHER): Payer: Self-pay | Admitting: Emergency Medicine

## 2017-03-19 ENCOUNTER — Emergency Department (HOSPITAL_BASED_OUTPATIENT_CLINIC_OR_DEPARTMENT_OTHER)
Admission: EM | Admit: 2017-03-19 | Discharge: 2017-03-19 | Disposition: A | Discharge status: Home / Self Care | Payer: Medicaid Other | Attending: Emergency Medicine | Admitting: Emergency Medicine

## 2017-03-19 DIAGNOSIS — Z87891 Personal history of nicotine dependence: Secondary | ICD-10-CM | POA: Insufficient documentation

## 2017-03-19 DIAGNOSIS — R531 Weakness: Secondary | ICD-10-CM

## 2017-03-19 DIAGNOSIS — Z79899 Other long term (current) drug therapy: Secondary | ICD-10-CM | POA: Insufficient documentation

## 2017-03-19 DIAGNOSIS — E860 Dehydration: Secondary | ICD-10-CM

## 2017-03-19 LAB — CBC
HCT: 35.4 % — ABNORMAL LOW (ref 36.0–46.0)
Hemoglobin: 12.2 g/dL (ref 12.0–15.0)
MCH: 30.7 pg (ref 26.0–34.0)
MCHC: 34.5 g/dL (ref 30.0–36.0)
MCV: 88.9 fL (ref 78.0–100.0)
Platelets: 208 10*3/uL (ref 150–400)
RBC: 3.98 MIL/uL (ref 3.87–5.11)
RDW: 13 % (ref 11.5–15.5)
WBC: 3.2 10*3/uL — AB (ref 4.0–10.5)

## 2017-03-19 LAB — COMPREHENSIVE METABOLIC PANEL
ALT: 16 U/L (ref 14–54)
AST: 27 U/L (ref 15–41)
Albumin: 4.8 g/dL (ref 3.5–5.0)
Alkaline Phosphatase: 36 U/L — ABNORMAL LOW (ref 38–126)
Anion gap: 10 (ref 5–15)
BUN: 9 mg/dL (ref 6–20)
CALCIUM: 9.6 mg/dL (ref 8.9–10.3)
CO2: 28 mmol/L (ref 22–32)
Chloride: 102 mmol/L (ref 101–111)
Creatinine, Ser: 0.59 mg/dL (ref 0.44–1.00)
Glucose, Bld: 98 mg/dL (ref 65–99)
Potassium: 3.6 mmol/L (ref 3.5–5.1)
Sodium: 140 mmol/L (ref 135–145)
Total Bilirubin: 0.7 mg/dL (ref 0.3–1.2)
Total Protein: 8.6 g/dL — ABNORMAL HIGH (ref 6.5–8.1)

## 2017-03-19 LAB — TROPONIN I: Troponin I: <0.03 ng/mL (ref <0.03)

## 2017-03-19 LAB — URINALYSIS, ROUTINE W REFLEX MICROSCOPIC
Bilirubin Urine: NEGATIVE
GLUCOSE, UA: NEGATIVE mg/dL
Hgb urine dipstick: NEGATIVE
KETONES UR: NEGATIVE mg/dL
NITRITE: NEGATIVE
Protein, ur: NEGATIVE mg/dL
Specific Gravity, Urine: 1.005 (ref 1.005–1.030)
pH: 7 (ref 5.0–8.0)

## 2017-03-19 LAB — URINALYSIS, MICROSCOPIC (REFLEX): RBC / HPF: NONE SEEN RBC/hpf (ref 0–5)

## 2017-03-19 MED ORDER — SODIUM CHLORIDE 0.9 % IV BOLUS (SEPSIS)
1000.0000 mL | Freq: Once | INTRAVENOUS | Status: AC
  Administered 2017-03-19: 1000 mL via INTRAVENOUS

## 2017-03-19 NOTE — Discharge Instructions (Signed)
It was our pleasure to provide your ER care today - we hope that you feel better.  Rest. Drink plenty of fluids.  Follow up with primary care doctor in 1 week.  Return to ER if worse, new symptoms, fevers, trouble breathing, chest pain, other concern.

## 2017-03-19 NOTE — ED Notes (Signed)
Patient denies of any palpitation at this time.  HR 85

## 2017-03-19 NOTE — ED Triage Notes (Signed)
Palpitations since yesterday. Denies pain and SOB

## 2017-03-19 NOTE — ED Provider Notes (Signed)
[facility]   CSN: [id number] Arrival date & time: 03/19/17  1230     History   Chief Complaint Chief Complaint  Patient presents with  . Weakness    HPI Brittany Mann is a 46 y.o. female.  Patient c/o feeling generally weak in past few days, stating she feels as if she is dehydrated.  Symptoms mild-mod, persistent. Felt worse otday. Denies abdominal pain. No vomiting or diarrhea. No fever or chills. Heartbeat felt mildly fast earlier. No syncope. Denies chest pain. No shortness of breath. Denies dysuria. States is postmenopausal, denies vaginal bleeding or d/c.  No recent meds/new med use. No known ill contacts.    The history is provided by the patient.  Weakness  Pertinent negatives include no shortness of breath, no chest pain, no vomiting, no confusion and no headaches.    History reviewed. No pertinent past medical history.  There are no active problems to display for this patient.   Past Surgical History:  Procedure Laterality Date  . ECTOPIC PREGNANCY SURGERY      OB History    No data available       Home Medications    Prior to Admission medications   Medication Sig Start Date End Date Taking? Authorizing Provider  Cetirizine HCl 10 MG CAPS Take 1 capsule (10 mg total) by mouth daily. 12/30/15   Vanetta MuldersZackowski, Scott, MD  naproxen (NAPROSYN) 500 MG tablet Take 1 tablet (500 mg total) by mouth 2 (two) times daily. 12/30/15   Vanetta MuldersZackowski, Scott, MD  penicillin v potassium (VEETID) 500 MG tablet Take 1 tablet (500 mg total) by mouth 2 (two) times daily. 09/11/16   Rolan BuccoBelfi, Melanie, MD    Family History No family history on file.  Social History Social History  Substance Use Topics  . Smoking status: Former Games developermoker  . Smokeless tobacco: Never Used  . Alcohol use No     Allergies   Shellfish allergy   Review of Systems Review of Systems  Constitutional: Negative for chills and fever.  HENT: Negative for sore throat.   Eyes:  Negative for visual disturbance.  Respiratory: Negative for cough and shortness of breath.   Cardiovascular: Negative for chest pain and leg swelling.  Gastrointestinal: Negative for abdominal pain, diarrhea and vomiting.  Endocrine: Negative for polyuria.  Genitourinary: Negative for dysuria and flank pain.  Musculoskeletal: Negative for back pain and neck pain.  Skin: Negative for rash.  Neurological: Positive for weakness. Negative for headaches.  Hematological: Does not bruise/bleed easily.  Psychiatric/Behavioral: Negative for confusion.     Physical Exam Updated Vital Signs BP 140/89 (BP Location: Left Arm)   Pulse (!) 110   Temp 98.2 F (36.8 C) (Oral)   Resp 18   SpO2 100%   Physical Exam  Constitutional: She appears well-developed and well-nourished. No distress.  HENT:  Mouth/Throat: Oropharynx is clear and moist.  Eyes: Conjunctivae are normal. No scleral icterus.  Neck: Neck supple. No tracheal deviation present. No thyromegaly present.  Cardiovascular: Normal rate, normal heart sounds and intact distal pulses.  Exam reveals no gallop and no friction rub.   No murmur heard. Mildly tachycardic.   Pulmonary/Chest: Effort normal and breath sounds normal. No respiratory distress.  Abdominal: Soft. Normal appearance and bowel sounds are normal. She exhibits no distension and no mass. There is no tenderness. There is no rebound and no guarding. No hernia.  Genitourinary:  Genitourinary Comments: No cva tenderness  Musculoskeletal: She exhibits no edema.  Neurological: She is alert.  Speech clear/fluent. Ambulates w steady gait.   Skin: Skin is warm and dry. No rash noted. She is not diaphoretic.  Psychiatric: She has a normal mood and affect.  Nursing note and vitals reviewed.    ED Treatments / Results  Labs (all labs ordered are listed, but only abnormal results are displayed)  Results for orders placed or performed during the hospital encounter of 03/19/17    CBC  Result Value Ref Range   WBC 3.2 (L) 4.0 - 10.5 K/uL   RBC 3.98 3.87 - 5.11 MIL/uL   Hemoglobin 12.2 12.0 - 15.0 g/dL   HCT 16.1 (L) 09.6 - 04.5 %   MCV 88.9 78.0 - 100.0 fL   MCH 30.7 26.0 - 34.0 pg   MCHC 34.5 30.0 - 36.0 g/dL   RDW 40.9 81.1 - 91.4 %   Platelets 208 150 - 400 K/uL  Comprehensive metabolic panel  Result Value Ref Range   Sodium 140 135 - 145 mmol/L   Potassium 3.6 3.5 - 5.1 mmol/L   Chloride 102 101 - 111 mmol/L   CO2 28 22 - 32 mmol/L   Glucose, Bld 98 65 - 99 mg/dL   BUN 9 6 - 20 mg/dL   Creatinine, Ser 7.82 0.44 - 1.00 mg/dL   Calcium 9.6 8.9 - 95.6 mg/dL   Total Protein 8.6 (H) 6.5 - 8.1 g/dL   Albumin 4.8 3.5 - 5.0 g/dL   AST 27 15 - 41 U/L   ALT 16 14 - 54 U/L   Alkaline Phosphatase 36 (L) 38 - 126 U/L   Total Bilirubin 0.7 0.3 - 1.2 mg/dL   GFR calc non Af Amer >60 >60 mL/min   GFR calc Af Amer >60 >60 mL/min   Anion gap 10 5 - 15  Urinalysis, Routine w reflex microscopic  Result Value Ref Range   Color, Urine YELLOW YELLOW   APPearance CLEAR CLEAR   Specific Gravity, Urine 1.005 1.005 - 1.030   pH 7.0 5.0 - 8.0   Glucose, UA NEGATIVE NEGATIVE mg/dL   Hgb urine dipstick NEGATIVE NEGATIVE   Bilirubin Urine NEGATIVE NEGATIVE   Ketones, ur NEGATIVE NEGATIVE mg/dL   Protein, ur NEGATIVE NEGATIVE mg/dL   Nitrite NEGATIVE NEGATIVE   Leukocytes, UA TRACE (A) NEGATIVE  Troponin I  Result Value Ref Range   Troponin I <0.03 <0.03 ng/mL  Urinalysis, Microscopic (reflex)  Result Value Ref Range   RBC / HPF NONE SEEN 0 - 5 RBC/hpf   WBC, UA 0-5 0 - 5 WBC/hpf   Bacteria, UA RARE (A) NONE SEEN   Squamous Epithelial / LPF 0-5 (A) NONE SEEN      EKG  EKG Interpretation  Date/Time:  Sunday March 19 2017 12:43:20 EDT Ventricular Rate:  121 PR Interval:  156 QRS Duration: 64 QT Interval:  302 QTC Calculation: 428 R Axis:   58 Text Interpretation:  Sinus tachycardia Biatrial enlargement Nonspecific ST abnormality No significant change since  last tracing Confirmed by Cathren Laine ([id number]) on 03/19/2017 12:49:20 PM       Radiology No results found.  Procedures Procedures (including critical care time)  Medications Ordered in ED Medications  sodium chloride 0.9 % bolus 1,000 mL (not administered)     Initial Impression / Assessment and Plan / ED Course  I have reviewed the triage vital signs and the nursing notes.  Pertinent labs & imaging results that were available during my care of the patient were reviewed  by me and considered in my medical decision making (see chart for details).  Iv ns bolus. Labs.  Reviewed nursing notes and prior charts for additional history.   Labs c/w baseline.  Po fluids, snack.  Pt tolerating po intake.  abd soft nt.   nsr on monitor. No dysrhythmia.  Pt currently appears stable for d/c.     Final Clinical Impressions(s) / ED Diagnoses   Final diagnoses:  None    New Prescriptions New Prescriptions   No medications on file     Cathren LaineSteinl, Leana Springston, MD 03/19/17 1515

## 2017-11-27 ENCOUNTER — Encounter (HOSPITAL_BASED_OUTPATIENT_CLINIC_OR_DEPARTMENT_OTHER): Payer: Self-pay | Admitting: *Deleted

## 2017-11-27 ENCOUNTER — Emergency Department (HOSPITAL_BASED_OUTPATIENT_CLINIC_OR_DEPARTMENT_OTHER)
Admission: EM | Admit: 2017-11-27 | Discharge: 2017-11-27 | Disposition: A | Discharge status: Home / Self Care | Payer: BLUE CROSS/BLUE SHIELD | Attending: Emergency Medicine | Admitting: Emergency Medicine

## 2017-11-27 ENCOUNTER — Other Ambulatory Visit: Payer: Self-pay

## 2017-11-27 DIAGNOSIS — Z87891 Personal history of nicotine dependence: Secondary | ICD-10-CM | POA: Diagnosis not present

## 2017-11-27 DIAGNOSIS — Z79899 Other long term (current) drug therapy: Secondary | ICD-10-CM | POA: Insufficient documentation

## 2017-11-27 DIAGNOSIS — R531 Weakness: Secondary | ICD-10-CM | POA: Diagnosis not present

## 2017-11-27 DIAGNOSIS — R197 Diarrhea, unspecified: Secondary | ICD-10-CM | POA: Diagnosis not present

## 2017-11-27 DIAGNOSIS — R112 Nausea with vomiting, unspecified: Secondary | ICD-10-CM | POA: Diagnosis present

## 2017-11-27 LAB — COMPREHENSIVE METABOLIC PANEL
ALT: 19 U/L (ref 14–54)
AST: 25 U/L (ref 15–41)
Albumin: 4 g/dL (ref 3.5–5.0)
Alkaline Phosphatase: 34 U/L — ABNORMAL LOW (ref 38–126)
Anion gap: 8 (ref 5–15)
BUN: 11 mg/dL (ref 6–20)
CO2: 25 mmol/L (ref 22–32)
Calcium: 9.1 mg/dL (ref 8.9–10.3)
Chloride: 108 mmol/L (ref 101–111)
Creatinine, Ser: 0.58 mg/dL (ref 0.44–1.00)
GFR calc Af Amer: >60 mL/min (ref >60)
GFR calc non Af Amer: >60 mL/min (ref >60)
Glucose, Bld: 93 mg/dL (ref 65–99)
Potassium: 3.2 mmol/L — ABNORMAL LOW (ref 3.5–5.1)
Sodium: 141 mmol/L (ref 135–145)
Total Bilirubin: 0.3 mg/dL (ref 0.3–1.2)
Total Protein: 7.1 g/dL (ref 6.5–8.1)

## 2017-11-27 LAB — URINALYSIS, MICROSCOPIC (REFLEX)

## 2017-11-27 LAB — CBC
HCT: 30.7 % — ABNORMAL LOW (ref 36.0–46.0)
Hemoglobin: 10.7 g/dL — ABNORMAL LOW (ref 12.0–15.0)
MCH: 31 pg (ref 26.0–34.0)
MCHC: 34.9 g/dL (ref 30.0–36.0)
MCV: 89 fL (ref 78.0–100.0)
Platelets: 206 10*3/uL (ref 150–400)
RBC: 3.45 MIL/uL — ABNORMAL LOW (ref 3.87–5.11)
RDW: 12.3 % (ref 11.5–15.5)
WBC: 3 10*3/uL — ABNORMAL LOW (ref 4.0–10.5)

## 2017-11-27 LAB — URINALYSIS, ROUTINE W REFLEX MICROSCOPIC
Bilirubin Urine: NEGATIVE
Glucose, UA: NEGATIVE mg/dL
Hgb urine dipstick: NEGATIVE
Ketones, ur: NEGATIVE mg/dL
Nitrite: NEGATIVE
Protein, ur: NEGATIVE mg/dL
Specific Gravity, Urine: 1.02 (ref 1.005–1.030)
pH: 7 (ref 5.0–8.0)

## 2017-11-27 LAB — LIPASE, BLOOD: Lipase: 40 U/L (ref 11–51)

## 2017-11-27 MED ORDER — SODIUM CHLORIDE 0.9 % IV BOLUS
1000.0000 mL | Freq: Once | INTRAVENOUS | Status: AC
  Administered 2017-11-27: 1000 mL via INTRAVENOUS

## 2017-11-27 NOTE — ED Notes (Signed)
Pt. Reports she has had the stomach bug and still feels weak from not being able to eat and drink for several days.  Pt. In no distress and is alert and oriented.

## 2017-11-27 NOTE — ED Triage Notes (Signed)
Last week she had vomiting and diarrhea. The symptoms resolved but she feels dehydrated.

## 2017-11-28 LAB — URINE CULTURE: Culture: <10000 — AB

## 2017-11-28 NOTE — ED Provider Notes (Signed)
[facility] EMERGENCY DEPARTMENT Provider Note   CSN: [id number] Arrival date & time: 11/27/17  1219     History   Chief Complaint Chief Complaint  Patient presents with  . Emesis    HPI ANURADHA CHABOT is a 47 y.o. female.  HPI   47 year old female with nausea, vomiting and diarrhea last week has since resolved.  She feels like she may be dehydrated though.  She feels generally tired and weak.  No more vomiting or diarrhea but she does have some food aversion because she is concerned that it may return.  No fevers or chills.  No urinary complaints. History reviewed. No pertinent past medical history.  There are no active problems to display for this patient.   Past Surgical History:  Procedure Laterality Date  . ECTOPIC PREGNANCY SURGERY       OB History   None      Home Medications    Prior to Admission medications   Medication Sig Start Date End Date Taking? Authorizing Provider  Cetirizine HCl 10 MG CAPS Take 1 capsule (10 mg total) by mouth daily. 12/30/15   Vanetta Mulders, MD  naproxen (NAPROSYN) 500 MG tablet Take 1 tablet (500 mg total) by mouth 2 (two) times daily. 12/30/15   Vanetta Mulders, MD  penicillin v potassium (VEETID) 500 MG tablet Take 1 tablet (500 mg total) by mouth 2 (two) times daily. 09/11/16   Rolan Bucco, MD    Family History No family history on file.  Social History Social History   Tobacco Use  . Smoking status: Former Games developer  . Smokeless tobacco: Never Used  Substance Use Topics  . Alcohol use: No  . Drug use: No     Allergies   Shellfish allergy   Review of Systems Review of Systems  All systems reviewed and negative, other than as noted in HPI.  Physical Exam Updated Vital Signs BP 123/82   Pulse 82   Temp 98.2 F (36.8 C) (Oral)   Resp 16   Ht 5\' 4"  (1.626 m)   Wt 46.7 kg (103 lb)   SpO2 100%   BMI 17.68 kg/m   Physical Exam  Constitutional: She appears well-developed and well-nourished.  No distress.  HENT:  Head: Normocephalic and atraumatic.  Eyes: Conjunctivae are normal. Right eye exhibits no discharge. Left eye exhibits no discharge.  Neck: Neck supple.  Cardiovascular: Normal rate, regular rhythm and normal heart sounds. Exam reveals no gallop and no friction rub.  No murmur heard. Pulmonary/Chest: Effort normal and breath sounds normal. No respiratory distress.  Abdominal: Soft. She exhibits no distension. There is no tenderness.  Musculoskeletal: She exhibits no edema or tenderness.  Neurological: She is alert.  Skin: Skin is warm and dry.  Psychiatric: She has a normal mood and affect. Her behavior is normal. Thought content normal.  Nursing note and vitals reviewed.    ED Treatments / Results  Labs (all labs ordered are listed, but only abnormal results are displayed) Labs Reviewed  COMPREHENSIVE METABOLIC PANEL - Abnormal; Notable for the following components:      Result Value   Potassium 3.2 (*)    Alkaline Phosphatase 34 (*)    All other components within normal limits  CBC - Abnormal; Notable for the following components:   WBC 3.0 (*)    RBC 3.45 (*)    Hemoglobin 10.7 (*)    HCT 30.7 (*)    All other components within normal limits  URINALYSIS, ROUTINE  W REFLEX MICROSCOPIC - Abnormal; Notable for the following components:   Leukocytes, UA SMALL (*)    All other components within normal limits  URINALYSIS, MICROSCOPIC (REFLEX) - Abnormal; Notable for the following components:   Bacteria, UA MANY (*)    Squamous Epithelial / LPF 0-5 (*)    All other components within normal limits  URINE CULTURE  LIPASE, BLOOD    EKG None  Radiology No results found.  Procedures Procedures (including critical care time)  Medications Ordered in ED Medications  sodium chloride 0.9 % bolus 1,000 mL (0 mLs Intravenous Stopped 11/27/17 1557)     Initial Impression / Assessment and Plan / ED Course  I have reviewed the triage vital signs and the  nursing notes.  Pertinent labs & imaging results that were available during my care of the patient were reviewed by me and considered in my medical decision making (see chart for details).     47 year old female with generalized weakness after what sounds like a recent viral GI illness.  Abdominal exam is benign.  She is afebrile.  Generally well-appearing.  He dynamically stable.  Basic labs fairly unremarkable.  She was treated with IV fluids with improvement.  She is requesting a work note.  I doubt emergent process.  Final Clinical Impressions(s) / ED Diagnoses   Final diagnoses:  Generalized weakness    ED Discharge Orders    None       Raeford RazorKohut, Bryonna Sundby, MD 11/28/17 1004

## 2018-02-10 ENCOUNTER — Encounter (HOSPITAL_BASED_OUTPATIENT_CLINIC_OR_DEPARTMENT_OTHER): Payer: Self-pay | Admitting: Emergency Medicine

## 2018-02-10 ENCOUNTER — Emergency Department (HOSPITAL_BASED_OUTPATIENT_CLINIC_OR_DEPARTMENT_OTHER)
Admission: EM | Admit: 2018-02-10 | Discharge: 2018-02-10 | Disposition: A | Discharge status: Home / Self Care | Payer: BLUE CROSS/BLUE SHIELD | Attending: Emergency Medicine | Admitting: Emergency Medicine

## 2018-02-10 ENCOUNTER — Other Ambulatory Visit: Payer: Self-pay

## 2018-02-10 DIAGNOSIS — J069 Acute upper respiratory infection, unspecified: Secondary | ICD-10-CM | POA: Insufficient documentation

## 2018-02-10 DIAGNOSIS — R05 Cough: Secondary | ICD-10-CM | POA: Diagnosis present

## 2018-02-10 DIAGNOSIS — Z87891 Personal history of nicotine dependence: Secondary | ICD-10-CM | POA: Diagnosis not present

## 2018-02-10 DIAGNOSIS — R059 Cough, unspecified: Secondary | ICD-10-CM

## 2018-02-10 DIAGNOSIS — Z79899 Other long term (current) drug therapy: Secondary | ICD-10-CM | POA: Insufficient documentation

## 2018-02-10 NOTE — Discharge Instructions (Addendum)
Continue to stay well-hydrated. Gargle warm salt water and spit it out and use chloraseptic spray as needed for sore throat. Continue to alternate between Tylenol and Ibuprofen for pain or fever. Use Mucinex or robitussin for cough suppression/expectoration of mucus. Use over the counter netipot sinus rinse and flonase nasal spray to help with nasal congestion. May consider over-the-counter Benadryl or other antihistamine like claritin/zyrtec/etc to decrease secretions and for help with your symptoms.  You do not need to start the antibiotic unless you are developing fevers, or getting worse over the next week instead of gradually improving. The antibiotic they gave you is perfectly safe, so if you do get worse or have fevers, then you can take the azithromycin that you were already prescribed. HOWEVER, if you are gradually improving and never develop fevers, then you DO NOT need the antibiotic.  Follow up with your primary care doctor in 5-7 days for recheck of ongoing symptoms. Return to emergency department for emergent changing or worsening of symptoms.

## 2018-02-10 NOTE — ED Provider Notes (Signed)
[facility] EMERGENCY DEPARTMENT Provider Note   CSN: [id number] Arrival date & time: 02/10/18  1836     History   Chief Complaint Chief Complaint  Patient presents with  . Medication Refill    HPI Brittany Mann is a 47 y.o. otherwise healthy female, who presents to the ED wanting a second opinion regarding antibiotics she was prescribed today.  Patient states that about 1 week ago she started having sinus congestion, a sore scratchy throat, and cough with green sputum production.  Per chart review, pt was seen by [facility] in [place] on 02/05/18 for these symptoms, was diagnosed with sinus congestion, refused nasal spray, was given rx for tesslon perles and advised supportive therapies.  She did not fill the tesslon because the pharmacist told her it would only suppress the cough and wouldn't "bring the mucus up" so she bought robitussin instead however never actually started taking it. She went back to [facility] today for recheck, they diagnosed her with bronchitis (no xray done) and rx'd her azithromycin and again advised that she do supportive tx at home as well.  She states that her symptoms are gradually improving, she has not tried anything for symptoms at home, no known aggravating factors.  She was concerned because she read the box on the azithromycin prescription and it mentions something about side effects with her heart, so she came in for second opinion.  She states that overall her symptoms are improving, and the main symptom that still bothers her is the cough with green sputum production, she feels like if she could cough the mucus up that she would feel much better, but she admits that she hadn't tried anything at home to help with this symptom.  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  She is a non-smoker.  She denies any fevers, chills, trismus, drooling, ear pain or drainage, wheezing, CP, SOB, abd pain, N/V/D/C, hematuria, dysuria, myalgias,  arthralgias, numbness, tingling, focal weakness, or any other complaints at this time.   The history is provided by the patient and medical records. No language interpreter was used.  Medication Refill    History reviewed. No pertinent past medical history.  There are no active problems to display for this patient.   Past Surgical History:  Procedure Laterality Date  . ECTOPIC PREGNANCY SURGERY       OB History   None      Home Medications    Prior to Admission medications   Medication Sig Start Date End Date Taking? Authorizing Provider  Cetirizine HCl 10 MG CAPS Take 1 capsule (10 mg total) by mouth daily. 12/30/15   Vanetta Mulders, MD  naproxen (NAPROSYN) 500 MG tablet Take 1 tablet (500 mg total) by mouth 2 (two) times daily. 12/30/15   Vanetta Mulders, MD  penicillin v potassium (VEETID) 500 MG tablet Take 1 tablet (500 mg total) by mouth 2 (two) times daily. 09/11/16   Rolan Bucco, MD    Family History History reviewed. No pertinent family history.  Social History Social History   Tobacco Use  . Smoking status: Former Games developer  . Smokeless tobacco: Never Used  Substance Use Topics  . Alcohol use: No  . Drug use: No     Allergies   Shellfish allergy   Review of Systems Review of Systems  Constitutional: Negative for chills and fever.  HENT: Positive for sinus pressure and sore throat. Negative for drooling, ear discharge, ear pain and trouble  swallowing.   Respiratory: Positive for cough. Negative for shortness of breath and wheezing.   Cardiovascular: Negative for chest pain.  Gastrointestinal: Negative for abdominal pain, constipation, diarrhea, nausea and vomiting.  Genitourinary: Negative for dysuria and hematuria.  Musculoskeletal: Negative for arthralgias and myalgias.  Skin: Negative for color change.  Allergic/Immunologic: Negative for immunocompromised state.  Neurological: Negative for weakness and numbness.  Psychiatric/Behavioral:  Negative for confusion.  All other systems reviewed and are negative for acute change except as noted in the HPI.     Physical Exam Updated Vital Signs BP (!) 147/54 (BP Location: Right Arm)   Pulse 88   Temp 98.9 F (37.2 C) (Oral)   Resp 20   Ht 5\' 4"  (1.626 m)   Wt 46.3 kg (102 lb)   SpO2 99%   BMI 17.51 kg/m   Physical Exam  Constitutional: She is oriented to person, place, and time. Vital signs are normal. She appears well-developed and well-nourished.  Non-toxic appearance. No distress.  Afebrile, nontoxic, NAD  HENT:  Head: Normocephalic and atraumatic.  Nose: Mucosal edema present.  Mouth/Throat: Uvula is midline, oropharynx is clear and moist and mucous membranes are normal. No trismus in the jaw. No uvula swelling. Tonsils are 0 on the right. Tonsils are 0 on the left. No tonsillar exudate.  Nose mildly congested. Oropharynx clear and moist, without uvular swelling or deviation, no trismus or drooling, no tonsillar swelling or erythema, no exudates.    Eyes: Conjunctivae and EOM are normal. Right eye exhibits no discharge. Left eye exhibits no discharge.  Neck: Normal range of motion. Neck supple.  Cardiovascular: Normal rate, regular rhythm, normal heart sounds and intact distal pulses. Exam reveals no gallop and no friction rub.  No murmur heard. Pulmonary/Chest: Effort normal and breath sounds normal. No respiratory distress. She has no decreased breath sounds. She has no wheezes. She has no rhonchi. She has no rales.  CTAB in all lung fields, no w/r/r, no hypoxia or increased WOB, speaking in full sentences, SpO2 99% on RA   Abdominal: Soft. Normal appearance and bowel sounds are normal. She exhibits no distension. There is no tenderness. There is no rigidity, no rebound, no guarding, no CVA tenderness, no tenderness at McBurney's point and negative Murphy's sign.  Musculoskeletal: Normal range of motion.  Neurological: She is alert and oriented to person, place, and  time. She has normal strength. No sensory deficit.  Skin: Skin is warm, dry and intact. No rash noted.  Psychiatric: She has a normal mood and affect.  Nursing note and vitals reviewed.    ED Treatments / Results  Labs (all labs ordered are listed, but only abnormal results are displayed) Labs Reviewed - No data to display  EKG None  Radiology No results found.  Procedures Procedures (including critical care time)  Medications Ordered in ED Medications - No data to display   Initial Impression / Assessment and Plan / ED Course  I have reviewed the triage vital signs and the nursing notes.  Pertinent labs & imaging results that were available during my care of the patient were reviewed by me and considered in my medical decision making (see chart for details).     47 y.o. female here for second opinion. Had URI symptoms x1wk, [facility] and diagnosed with sinus pressure/URI, rx'd tesslon but refused nasal spray; advised symptomatic tx with OTC remedies; went back today and was told she had bronchitis so given rx for azithromycin. After reading  the package labeling, she was concerned about the side effects regarding heart problems, and so she came here for second opinion. Feels overall like she's improving. Not having fevers. On exam, clear lung exam, no rhonchi/rales, mild nasal congestion, throat clear, afebrile and nontoxic. Overall, likely viral in origin anyway, doubt need for abx at this time; looked back at EKGs and she has never had any prolonged QTs so I explained to the pt that she does not need to be concerned about this side effect in her case, however that I felt she doesn't really need the abx at this time anyway. Advised OTC remedies for symptomatic relief, and if fevers develop or symptoms worsen over the next 1wk then she can start the zpak, but if no worsening in symptoms and no fevers then she doesn't need to start this. Advised antihistamines, nasal  spray/rinse, and robitussin/mucinex. F/up with PCP in 1wk for recheck. Doubt need for xrays or other emergent labs/imaging at this time. I explained the diagnosis and have given explicit precautions to return to the ER including for any other new or worsening symptoms. The patient understands and accepts the medical plan as it's been dictated and I have answered their questions. Discharge instructions concerning home care and prescriptions have been given. The patient is STABLE and is discharged to home in good condition.    Final Clinical Impressions(s) / ED Diagnoses   Final diagnoses:  Viral upper respiratory tract infection  Cough    ED Discharge Orders    [address] [place], [place] 02/10/18 2025    Charlynne Pander, MD 02/10/18 2356

## 2018-02-10 NOTE — ED Triage Notes (Signed)
Patient states that she was given Zithromax for a recent infection. The patient reports that she is concerned about the side effects listed and wants to know if she can be given an RX for another medication

## 2018-03-26 ENCOUNTER — Encounter (HOSPITAL_BASED_OUTPATIENT_CLINIC_OR_DEPARTMENT_OTHER): Payer: Self-pay

## 2018-03-26 ENCOUNTER — Other Ambulatory Visit: Payer: Self-pay

## 2018-03-26 ENCOUNTER — Emergency Department (HOSPITAL_BASED_OUTPATIENT_CLINIC_OR_DEPARTMENT_OTHER)
Admission: EM | Admit: 2018-03-26 | Discharge: 2018-03-26 | Disposition: A | Discharge status: Home / Self Care | Payer: Medicaid Other | Attending: Emergency Medicine | Admitting: Emergency Medicine

## 2018-03-26 DIAGNOSIS — Z79899 Other long term (current) drug therapy: Secondary | ICD-10-CM | POA: Insufficient documentation

## 2018-03-26 DIAGNOSIS — R42 Dizziness and giddiness: Secondary | ICD-10-CM | POA: Diagnosis not present

## 2018-03-26 DIAGNOSIS — R002 Palpitations: Secondary | ICD-10-CM | POA: Diagnosis present

## 2018-03-26 DIAGNOSIS — Z87891 Personal history of nicotine dependence: Secondary | ICD-10-CM | POA: Insufficient documentation

## 2018-03-26 DIAGNOSIS — D649 Anemia, unspecified: Secondary | ICD-10-CM | POA: Insufficient documentation

## 2018-03-26 HISTORY — DX: Other allergic rhinitis: J30.89

## 2018-03-26 LAB — URINALYSIS, ROUTINE W REFLEX MICROSCOPIC
Bilirubin Urine: NEGATIVE
Glucose, UA: NEGATIVE mg/dL
Hgb urine dipstick: NEGATIVE
KETONES UR: NEGATIVE mg/dL
Nitrite: NEGATIVE
Protein, ur: NEGATIVE mg/dL
Specific Gravity, Urine: <1.005 — ABNORMAL LOW (ref 1.005–1.030)
pH: 7.5 (ref 5.0–8.0)

## 2018-03-26 LAB — COMPREHENSIVE METABOLIC PANEL
ALK PHOS: 38 U/L (ref 38–126)
ALT: 19 U/L (ref 0–44)
ANION GAP: 8 (ref 5–15)
AST: 29 U/L (ref 15–41)
Albumin: 4.2 g/dL (ref 3.5–5.0)
BILIRUBIN TOTAL: 0.4 mg/dL (ref 0.3–1.2)
BUN: 13 mg/dL (ref 6–20)
CALCIUM: 9.4 mg/dL (ref 8.9–10.3)
CO2: 29 mmol/L (ref 22–32)
Chloride: 104 mmol/L (ref 98–111)
Creatinine, Ser: 0.52 mg/dL (ref 0.44–1.00)
GFR calc non Af Amer: >60 mL/min (ref >60)
Glucose, Bld: 136 mg/dL — ABNORMAL HIGH (ref 70–99)
Potassium: 3.9 mmol/L (ref 3.5–5.1)
Sodium: 141 mmol/L (ref 135–145)
TOTAL PROTEIN: 7.6 g/dL (ref 6.5–8.1)

## 2018-03-26 LAB — CBC WITH DIFFERENTIAL/PLATELET
BASOS ABS: 0 10*3/uL (ref 0.0–0.1)
Basophils Relative: 1 %
Eosinophils Absolute: 0 10*3/uL (ref 0.0–0.7)
Eosinophils Relative: 1 %
HEMATOCRIT: 32.8 % — AB (ref 36.0–46.0)
Hemoglobin: 11.2 g/dL — ABNORMAL LOW (ref 12.0–15.0)
Lymphocytes Relative: 36 %
Lymphs Abs: 1.2 10*3/uL (ref 0.7–4.0)
MCH: 30.3 pg (ref 26.0–34.0)
MCHC: 34.1 g/dL (ref 30.0–36.0)
MCV: 88.6 fL (ref 78.0–100.0)
Monocytes Absolute: 0.3 10*3/uL (ref 0.1–1.0)
Monocytes Relative: 8 %
NEUTROS ABS: 1.7 10*3/uL (ref 1.7–7.7)
Neutrophils Relative %: 54 %
Platelets: 190 10*3/uL (ref 150–400)
RBC: 3.7 MIL/uL — ABNORMAL LOW (ref 3.87–5.11)
RDW: 13.2 % (ref 11.5–15.5)
WBC: 3.3 10*3/uL — AB (ref 4.0–10.5)

## 2018-03-26 LAB — URINALYSIS, MICROSCOPIC (REFLEX): RBC / HPF: NONE SEEN RBC/hpf (ref 0–5)

## 2018-03-26 MED ORDER — SODIUM CHLORIDE 0.9 % IV BOLUS
1000.0000 mL | Freq: Once | INTRAVENOUS | Status: AC
  Administered 2018-03-26: 1000 mL via INTRAVENOUS

## 2018-03-26 NOTE — Discharge Instructions (Signed)
If your dizziness worsens, you develop a headache, fever, vomiting, weakness or numbness in your arms or legs, blurry vision, chest pain, shortness of breath or any other new/concerning symptoms and return to the ER for evaluation.  Otherwise follow-up with your primary care physician.

## 2018-03-26 NOTE — ED Provider Notes (Signed)
[facility] EMERGENCY DEPARTMENT Provider Note   CSN: [id number] Arrival date & time: 03/26/18  1710     History   Chief Complaint Chief Complaint  Patient presents with  . Palpitations    HPI Les Brittany Mann is a 47 y.o. female.  HPI  47 year old female presents with concern for dehydration.  She is felt this way multiple times in the past.  She states that over the last couple days she is had intermittent palpitations and has been feeling some URI/allergy symptoms such as congestion, mild sinus pressure to her forehead and some mild sore throat and dry cough.  No shortness of breath or chest pain.  Today she was feeling dizzy/lightheaded while at work.  She works with childcare.  She states she is at a new job over the last few weeks and drinks significantly less water during the day because she is so much busier and on her feet.  Feels a little off balance a little lightheaded but no room spinning sensation or syncope.  No headache, blurry vision or vomiting.  Past Medical History:  Diagnosis Date  . Environmental and seasonal allergies     There are no active problems to display for this patient.   Past Surgical History:  Procedure Laterality Date  . ECTOPIC PREGNANCY SURGERY       OB History   None      Home Medications    Prior to Admission medications   Medication Sig Start Date End Date Taking? Authorizing Provider  Cetirizine HCl 10 MG CAPS Take 1 capsule (10 mg total) by mouth daily. 12/30/15   Vanetta MuldersZackowski, Tyquarius Paglia, MD  naproxen (NAPROSYN) 500 MG tablet Take 1 tablet (500 mg total) by mouth 2 (two) times daily. 12/30/15   Vanetta MuldersZackowski, Yamileth Hayse, MD  penicillin v potassium (VEETID) 500 MG tablet Take 1 tablet (500 mg total) by mouth 2 (two) times daily. 09/11/16   Rolan BuccoBelfi, Melanie, MD    Family History No family history on file.  Social History Social History   Tobacco Use  . Smoking status: Former Games developermoker  . Smokeless tobacco: Never Used  Substance Use  Topics  . Alcohol use: No  . Drug use: No     Allergies   Shellfish allergy   Review of Systems Review of Systems  Constitutional: Negative for fever.  HENT: Positive for congestion and sinus pressure. Negative for ear pain (mild ear pressure but no pain).   Eyes: Negative for visual disturbance.  Respiratory: Positive for cough. Negative for shortness of breath.   Cardiovascular: Negative for chest pain.  Gastrointestinal: Negative for abdominal pain, nausea and vomiting.  Genitourinary: Positive for dysuria.  Neurological: Positive for dizziness. Negative for syncope and headaches.  All other systems reviewed and are negative.    Physical Exam Updated Vital Signs BP (!) 147/74   Pulse (!) 107   Temp 98.3 F (36.8 C) (Oral)   Resp 17   Ht 5\' 4"  (1.626 m)   Wt 47.6 kg (105 lb)   SpO2 100%   BMI 18.02 kg/m   Physical Exam  Constitutional: She is oriented to person, place, and time. She appears well-developed and well-nourished.  HENT:  Head: Normocephalic and atraumatic.  Right Ear: Tympanic membrane and external ear normal.  Left Ear: Tympanic membrane and external ear normal.  Nose: Nose normal.  Eyes: Pupils are equal, round, and reactive to light. EOM are normal. Right eye exhibits no discharge. Left eye exhibits no discharge. Right eye exhibits no  nystagmus. Left eye exhibits no nystagmus.  Neck: Neck supple.  Cardiovascular: Regular rhythm and normal heart sounds. Tachycardia present.  Pulmonary/Chest: Effort normal and breath sounds normal.  Abdominal: Soft. There is no tenderness.  Neurological: She is alert and oriented to person, place, and time.  CN 3-12 grossly intact. 5/5 strength in all 4 extremities. Grossly normal sensation. Normal finger to nose.   Skin: Skin is warm and dry.  Nursing note and vitals reviewed.    ED Treatments / Results  Labs (all labs ordered are listed, but only abnormal results are displayed) Labs Reviewed  COMPREHENSIVE  METABOLIC PANEL - Abnormal; Notable for the following components:      Result Value   Glucose, Bld 136 (*)    All other components within normal limits  CBC WITH DIFFERENTIAL/PLATELET - Abnormal; Notable for the following components:   WBC 3.3 (*)    RBC 3.70 (*)    Hemoglobin 11.2 (*)    HCT 32.8 (*)    All other components within normal limits  URINALYSIS, ROUTINE W REFLEX MICROSCOPIC - Abnormal; Notable for the following components:   Specific Gravity, Urine <1.005 (*)    Leukocytes, UA TRACE (*)    All other components within normal limits  URINALYSIS, MICROSCOPIC (REFLEX) - Abnormal; Notable for the following components:   Bacteria, UA RARE (*)    All other components within normal limits    EKG EKG Interpretation  Date/Time:  Monday March 26 2018 17:22:50 EDT Ventricular Rate:  109 PR Interval:  128 QRS Duration: 72 QT Interval:  326 QTC Calculation: 439 R Axis:   54 Text Interpretation:  Sinus tachycardia Right atrial enlargement Left ventricular hypertrophy with repolarization abnormality Abnormal ECG nonspecific ST/T changes similar to July 2018 Confirmed by Pricilla Loveless [phone]) on 03/26/2018 6:15:18 PM   Radiology No results found.  Procedures Procedures (including critical care time)  Medications Ordered in ED Medications  sodium chloride 0.9 % bolus 1,000 mL (1,000 mLs Intravenous New Bag/Given 03/26/18 1822)     Initial Impression / Assessment and Plan / ED Course  I have reviewed the triage vital signs and the nursing notes.  Pertinent labs & imaging results that were available during my care of the patient were reviewed by me and considered in my medical decision making (see chart for details).     Patient's anemia is stable from baseline.  Otherwise labs are unremarkable.  She describes a little bit of dysuria once today but states she also has vaginal atrophy and this also feels similar.  UA not really consistent with UTI.  She feels better with  fluids and her tachycardia has resolved.  This is probably related to some dehydration.  The dizziness is hard to describe but there is no nystagmus or obvious neuro dysfunction and so I think stroke is highly unlikely.  Otherwise, drink plenty of fluids and follow-up with PCP.  Return precautions.   Final Clinical Impressions(s) / ED Diagnoses   Final diagnoses:  Dizziness  Chronic anemia    ED Discharge Orders    None       Pricilla Loveless, MD 03/26/18 2001

## 2018-03-26 NOTE — ED Triage Notes (Signed)
C/o palpitations x 2-3 days[facility]-states feels same as when she has been dehydrated in the past-NAD-steady gait

## 2018-07-07 ENCOUNTER — Emergency Department (HOSPITAL_BASED_OUTPATIENT_CLINIC_OR_DEPARTMENT_OTHER)
Admission: EM | Admit: 2018-07-07 | Discharge: 2018-07-07 | Disposition: A | Discharge status: Home / Self Care | Payer: Medicaid Other | Attending: Emergency Medicine | Admitting: Emergency Medicine

## 2018-07-07 ENCOUNTER — Other Ambulatory Visit: Payer: Self-pay

## 2018-07-07 ENCOUNTER — Encounter (HOSPITAL_BASED_OUTPATIENT_CLINIC_OR_DEPARTMENT_OTHER): Payer: Self-pay | Admitting: Emergency Medicine

## 2018-07-07 DIAGNOSIS — R5381 Other malaise: Secondary | ICD-10-CM | POA: Insufficient documentation

## 2018-07-07 DIAGNOSIS — R5383 Other fatigue: Secondary | ICD-10-CM | POA: Insufficient documentation

## 2018-07-07 NOTE — ED Notes (Signed)
Patient offered chest xray.  Refused xray.  States "I have a phobia about xrays".

## 2018-07-07 NOTE — ED Provider Notes (Signed)
[facility] EMERGENCY DEPARTMENT Provider Note   CSN: [id number] Arrival date & time: 07/07/18  [id number]     History   Chief Complaint Chief Complaint  Patient presents with  . Cough    HPI Brittany Mann is a 47 y.o. female.  HPI Patient presents with generalized complaint of fatigue and generalized weakness.  This has been going on for many months.  Patient reports that she sees her physician and they have been following her iron levels.  She reports she is taking a iron supplement as prescribed.  She reports she still just feels like she is low on energy.  Patient does not describe any acute changes.  She does not have any areas of bleeding.  She has actually had some weight gain over the past several months.  She reports that she was having a lot of stress related issues early in the year and states that she had gotten down to 80 some pounds.  She reports now she weighs 105 pounds.  She reports she ate spaghetti yesterday.  She reports she had part of a sausage biscuit this morning her breakfast before coming to the emergency department.  She denies she has any pain with eating.  She denies nausea or vomiting after eating.  She denies problems with diarrhea or blood in the stool.  Patient focused and not having symptoms of postnasal drip.  She reports that seems seasonal.  She reports this morning she awakened and could feel some dripping in the back of her throat.  She denies she is having cough, shortness of breath or chest pain. Past Medical History:  Diagnosis Date  . Environmental and seasonal allergies     There are no active problems to display for this patient.   Past Surgical History:  Procedure Laterality Date  . ECTOPIC PREGNANCY SURGERY       OB History   None      Home Medications    Prior to Admission medications   Medication Sig Start Date End Date Taking? Authorizing Provider  IRON, FERROUS GLUCONATE, PO Take by mouth.   Yes [provider]  Cetirizine HCl 10 MG CAPS Take 1 capsule (10 mg total) by mouth daily. 12/30/15   Vanetta MuldersZackowski, Scott, MD  naproxen (NAPROSYN) 500 MG tablet Take 1 tablet (500 mg total) by mouth 2 (two) times daily. 12/30/15   Vanetta MuldersZackowski, Scott, MD  penicillin v potassium (VEETID) 500 MG tablet Take 1 tablet (500 mg total) by mouth 2 (two) times daily. 09/11/16   Rolan BuccoBelfi, Melanie, MD    Family History History reviewed. No pertinent family history.  Social History Social History   Tobacco Use  . Smoking status: Former Games developermoker  . Smokeless tobacco: Never Used  Substance Use Topics  . Alcohol use: No  . Drug use: No     Allergies   Shellfish allergy   Review of Systems Review of Systems  10 Systems reviewed and are negative for acute change except as noted in the HPI.  Physical Exam Updated Vital Signs BP (!) 148/65   Pulse (!) 103   Temp 98.3 F (36.8 C) (Oral)   Resp 20   Ht 5\' 4"  (1.626 m)   Wt 47.6 kg   SpO2 100%   BMI 18.02 kg/m   Physical Exam  Constitutional: She is oriented to person, place, and time.  Patient is alert and nontoxic.  She is clinically well in appearance.  Patient is thin but does not appear  malnourished.  HENT:  Head: Normocephalic and atraumatic.  Mouth/Throat: Oropharynx is clear and moist.  Mucous membranes are pink and moist.  Posterior oropharynx is widely patent.  Eyes: Pupils are equal, round, and reactive to light. EOM are normal.  Neck: Neck supple.  No cervical adenopathy or thyromegaly.  Neck is supple.  Cardiovascular: Normal rate, regular rhythm, normal heart sounds and intact distal pulses.  Pulmonary/Chest: Effort normal and breath sounds normal.  Abdominal: Soft. She exhibits no distension and no mass. There is no tenderness. There is no guarding.  Musculoskeletal: Normal range of motion. She exhibits no edema, tenderness or deformity.  Neurological: She is alert and oriented to person, place, and time. No cranial nerve deficit.  Coordination normal.  Skin: Skin is warm and dry.  Psychiatric: She has a normal mood and affect.     ED Treatments / Results  Labs (all labs ordered are listed, but only abnormal results are displayed) Labs Reviewed - No data to display  EKG None  Radiology No results found.  Procedures Procedures (including critical care time)  Medications Ordered in ED Medications - No data to display   Initial Impression / Assessment and Plan / ED Course  I have reviewed the triage vital signs and the nursing notes.  Pertinent labs & imaging results that were available during my care of the patient were reviewed by me and considered in my medical decision making (see chart for details).    Patient clinically appears well.  She has had chronic problems with fatigue over up to 6 months.  She describes her physician as monitoring her blood counts and showed me her iron study values.  Her values are close to normal or in the low normal range.  Nothing in her previous lab work suggest an acute etiology for her symptoms.  Patient does not describe weight loss, pain with eating.  She describes that she is eating balanced meals.  At this time, there is no focal etiology for further diagnostic testing today.  Patient's clinical appearance as well.  I recommend that she continue to work with her primary care provider who does appear to be doing evaluations regarding patient's chief complaint.  Final Clinical Impressions(s) / ED Diagnoses   Final diagnoses:  Malaise and fatigue    ED Discharge Orders    None       Arby Barrette, MD 07/07/18 1142

## 2018-07-07 NOTE — Discharge Instructions (Addendum)
1.  Continue to work with your family doctor regarding ongoing problems with general fatigue. 2.  Try to eat a healthy balanced diet.  Try to eat 3 moderate meals a day.  Try to get at least 8 hours of sleep at night. 3.  Return to the emergency department if you develop fever, focal pain, worsening symptoms or other concerns.

## 2018-07-07 NOTE — ED Triage Notes (Signed)
Reports cough and fatigue for "a few weeks".  Denies fever.

## 2019-11-01 ENCOUNTER — Other Ambulatory Visit: Payer: Self-pay

## 2019-11-01 ENCOUNTER — Encounter (HOSPITAL_BASED_OUTPATIENT_CLINIC_OR_DEPARTMENT_OTHER): Payer: Self-pay | Admitting: Emergency Medicine

## 2019-11-01 ENCOUNTER — Emergency Department (HOSPITAL_BASED_OUTPATIENT_CLINIC_OR_DEPARTMENT_OTHER)
Admission: EM | Admit: 2019-11-01 | Discharge: 2019-11-01 | Disposition: A | Discharge status: Home / Self Care | Payer: Medicaid Other | Attending: Emergency Medicine | Admitting: Emergency Medicine

## 2019-11-01 DIAGNOSIS — Z79899 Other long term (current) drug therapy: Secondary | ICD-10-CM | POA: Insufficient documentation

## 2019-11-01 DIAGNOSIS — G44209 Tension-type headache, unspecified, not intractable: Secondary | ICD-10-CM | POA: Insufficient documentation

## 2019-11-01 DIAGNOSIS — Z87891 Personal history of nicotine dependence: Secondary | ICD-10-CM | POA: Insufficient documentation

## 2019-11-01 DIAGNOSIS — Z91013 Allergy to seafood: Secondary | ICD-10-CM | POA: Insufficient documentation

## 2019-11-01 DIAGNOSIS — R519 Headache, unspecified: Secondary | ICD-10-CM

## 2019-11-01 LAB — URINALYSIS, ROUTINE W REFLEX MICROSCOPIC
Bilirubin Urine: NEGATIVE
Glucose, UA: NEGATIVE mg/dL
Hgb urine dipstick: NEGATIVE
Ketones, ur: NEGATIVE mg/dL
Leukocytes,Ua: NEGATIVE
Nitrite: NEGATIVE
Protein, ur: NEGATIVE mg/dL
Specific Gravity, Urine: 1.02 (ref 1.005–1.030)
pH: 7.5 (ref 5.0–8.0)

## 2019-11-01 LAB — COMPREHENSIVE METABOLIC PANEL
ALT: 16 U/L (ref 0–44)
AST: 24 U/L (ref 15–41)
Albumin: 4.3 g/dL (ref 3.5–5.0)
Alkaline Phosphatase: 31 U/L — ABNORMAL LOW (ref 38–126)
Anion gap: 9 (ref 5–15)
BUN: 15 mg/dL (ref 6–20)
CO2: 25 mmol/L (ref 22–32)
Calcium: 9.1 mg/dL (ref 8.9–10.3)
Chloride: 105 mmol/L (ref 98–111)
Creatinine, Ser: 0.53 mg/dL (ref 0.44–1.00)
GFR calc Af Amer: >60 mL/min (ref >60)
GFR calc non Af Amer: >60 mL/min (ref >60)
Glucose, Bld: 112 mg/dL — ABNORMAL HIGH (ref 70–99)
Potassium: 3.3 mmol/L — ABNORMAL LOW (ref 3.5–5.1)
Sodium: 139 mmol/L (ref 135–145)
Total Bilirubin: 0.6 mg/dL (ref 0.3–1.2)
Total Protein: 7.7 g/dL (ref 6.5–8.1)

## 2019-11-01 LAB — CBC WITH DIFFERENTIAL/PLATELET
Abs Immature Granulocytes: 0.01 10*3/uL (ref 0.00–0.07)
Basophils Absolute: 0 10*3/uL (ref 0.0–0.1)
Basophils Relative: 0 %
Eosinophils Absolute: 0 10*3/uL (ref 0.0–0.5)
Eosinophils Relative: 0 %
HCT: 35.9 % — ABNORMAL LOW (ref 36.0–46.0)
Hemoglobin: 11.7 g/dL — ABNORMAL LOW (ref 12.0–15.0)
Immature Granulocytes: 0 %
Lymphocytes Relative: 16 %
Lymphs Abs: 1 10*3/uL (ref 0.7–4.0)
MCH: 30.5 pg (ref 26.0–34.0)
MCHC: 32.6 g/dL (ref 30.0–36.0)
MCV: 93.5 fL (ref 80.0–100.0)
Monocytes Absolute: 0.3 10*3/uL (ref 0.1–1.0)
Monocytes Relative: 5 %
Neutro Abs: 4.9 10*3/uL (ref 1.7–7.7)
Neutrophils Relative %: 79 %
Platelets: 177 10*3/uL (ref 150–400)
RBC: 3.84 MIL/uL — ABNORMAL LOW (ref 3.87–5.11)
RDW: 13.3 % (ref 11.5–15.5)
WBC: 6.3 10*3/uL (ref 4.0–10.5)
nRBC: 0 % (ref 0.0–0.2)

## 2019-11-01 MED ORDER — SODIUM CHLORIDE 0.9 % IV BOLUS
1000.0000 mL | Freq: Once | INTRAVENOUS | Status: AC
  Administered 2019-11-01: 1000 mL via INTRAVENOUS

## 2019-11-01 NOTE — Discharge Instructions (Addendum)
Drink plenty of fluids.  Please follow-up with your primary care provider in 2 days for continued evaluation.  Return to the emergency room immediately for new or worsening symptoms or concerns, such as fevers, vomiting, chest pain, shortness of breath or any concerns at all.

## 2019-11-01 NOTE — ED Provider Notes (Signed)
[facility] EMERGENCY DEPARTMENT Provider Note   CSN: [id number] Arrival date & time: 11/01/19  3235     History Chief Complaint  Patient presents with  . Nausea  . Headache    Brittany Mann is a 49 y.o. female.  HPI      49 year old female, with PMH of seasonal allergies, presents with fatigue and headache.  Patient states that she has not felt well for over the last 24 hours due to her daughter's wedding yesterday.  Patient states that symptoms started this morning when she woke up.  She had a throbbing pain between her eyes which she states is gradually improved since waking up.  She states usually she gets fluids for this and it resolves her headache.  She states she has had a headache similar to this previously and each time when she is given fluids symptoms resolved.  She denies sudden onset of headache, worst headache of her life.  She denies any associated fevers, neck pain, neck stiffness, rashes.  Regarding patient's "fatigue" she states this is because she did not eat yesterday due to being busy getting ready for her daughter's wedding.  She denies any fevers, chills, nausea, vomiting, abdominal pain.  Patient also notes urinary frequency but denies any dysuria, urinary urgency.  She states she was checked for UTI a week ago and told that she did not have one.    Past Medical History:  Diagnosis Date  . Environmental and seasonal allergies     There are no problems to display for this patient.   Past Surgical History:  Procedure Laterality Date  . ECTOPIC PREGNANCY SURGERY       OB History   No obstetric history on file.     No family history on file.  Social History   Tobacco Use  . Smoking status: Former Games developer  . Smokeless tobacco: Never Used  Substance Use Topics  . Alcohol use: No  . Drug use: No    Home Medications Prior to Admission medications   Medication Sig Start Date End Date Taking? Authorizing Provider  Cetirizine HCl 10  MG CAPS Take 1 capsule (10 mg total) by mouth daily. 12/30/15   Vanetta Mulders, MD  IRON, FERROUS GLUCONATE, PO Take by mouth.    [provider]  naproxen (NAPROSYN) 500 MG tablet Take 1 tablet (500 mg total) by mouth 2 (two) times daily. 12/30/15   Vanetta Mulders, MD  penicillin v potassium (VEETID) 500 MG tablet Take 1 tablet (500 mg total) by mouth 2 (two) times daily. 09/11/16   Rolan Bucco, MD    Allergies    Shellfish allergy  Review of Systems   Review of Systems  Constitutional: Positive for fatigue. Negative for chills and fever.  Respiratory: Negative for shortness of breath.   Cardiovascular: Negative for chest pain.  Gastrointestinal: Negative for abdominal pain, nausea and vomiting.  Genitourinary: Positive for frequency. Negative for dysuria.  Musculoskeletal: Negative for neck pain.  Neurological: Positive for headaches.    Physical Exam Updated Vital Signs BP 129/72 (BP Location: Left Arm)   Pulse 87   Temp (!) 97.5 F (36.4 C) (Oral)   Resp 14   Ht 5\' 4"  (1.626 m)   Wt 48.2 kg   SpO2 100%   BMI 18.23 kg/m   Physical Exam Vitals and nursing note reviewed.  Constitutional:      Appearance: She is well-developed. She is not ill-appearing.  HENT:     Head: Normocephalic and  atraumatic.  Eyes:     Conjunctiva/sclera: Conjunctivae normal.  Cardiovascular:     Rate and Rhythm: Normal rate and regular rhythm.     Heart sounds: Normal heart sounds. No murmur.  Pulmonary:     Effort: Pulmonary effort is normal. No respiratory distress.     Breath sounds: Normal breath sounds. No wheezing or rales.  Abdominal:     General: Bowel sounds are normal. There is no distension.     Palpations: Abdomen is soft.     Tenderness: There is no abdominal tenderness.  Musculoskeletal:        General: No tenderness or deformity. Normal range of motion.     Cervical back: Neck supple.  Skin:    General: Skin is warm and dry.     Findings: No erythema or  rash.  Neurological:     General: No focal deficit present.     Mental Status: She is alert and oriented to person, place, and time.     Cranial Nerves: Cranial nerves are intact.     Sensory: Sensation is intact.     Motor: Motor function is intact.     Coordination: Coordination is intact.  Psychiatric:        Behavior: Behavior normal.     ED Results / Procedures / Treatments   Labs (all labs ordered are listed, but only abnormal results are displayed) Labs Reviewed  CBC WITH DIFFERENTIAL/PLATELET  COMPREHENSIVE METABOLIC PANEL  URINALYSIS, ROUTINE W REFLEX MICROSCOPIC    EKG None  Radiology No results found.  Procedures Procedures (including critical care time)  Medications Ordered in ED Medications  sodium chloride 0.9 % bolus 1,000 mL (has no administration in time range)    ED Course  I have reviewed the triage vital signs and the nursing notes.  Pertinent labs & imaging results that were available during my care of the patient were reviewed by me and considered in my medical decision making (see chart for details).    MDM Rules/Calculators/A&P                       Patient presents with complaints of headache and fatigue.  She states she gets this symptom yearly.  She denies any change in her headache pattern, new or worsening headache, worst headache of her life, sudden onset of headache.  History physical not consistent with subarachnoid, CVA.  Her neuro exam is nonfocal, nonlateralizing.  Vital signs stable.  Patient is very well-appearing, no acute distress, nontoxic, non-lethargic.  She states she is hungry.  She was given cheese, crackers and soda.  Patient requested blood work and fluids.  She declined other medications.  CBC shows anemia which is stable compared to prior.  CMP unremarkable.  Urine not suggestive of UTI.  Patient feeling better after fluids.  Encourage follow-up with primary care.  Given return precautions.  Patient ready and stable for  discharge.    At this time there does not appear to be any evidence of an acute emergency medical condition and the patient appears stable for discharge with appropriate outpatient follow up.Diagnosis was discussed with patient who verbalizes understanding and is agreeable to discharge.     Final Clinical Impression(s) / ED Diagnoses Final diagnoses:  None    Rx / DC Orders ED Discharge Orders    None       Rachel Moulds 11/01/19 1731    Truddie Hidden, MD 11/02/19 [phone]

## 2019-11-01 NOTE — ED Triage Notes (Signed)
Pt reports nausea and headache starting yesterday. Multiple complaints. Sts"history of dehydration". Denies abd pain.

## 2020-01-28 ENCOUNTER — Emergency Department (HOSPITAL_BASED_OUTPATIENT_CLINIC_OR_DEPARTMENT_OTHER)
Admission: EM | Admit: 2020-01-28 | Discharge: 2020-01-28 | Disposition: A | Discharge status: Home / Self Care | Payer: Managed Care, Other (non HMO) | Attending: Emergency Medicine | Admitting: Emergency Medicine

## 2020-01-28 ENCOUNTER — Other Ambulatory Visit: Payer: Self-pay

## 2020-01-28 ENCOUNTER — Encounter (HOSPITAL_BASED_OUTPATIENT_CLINIC_OR_DEPARTMENT_OTHER): Payer: Self-pay | Admitting: *Deleted

## 2020-01-28 DIAGNOSIS — J302 Other seasonal allergic rhinitis: Secondary | ICD-10-CM | POA: Diagnosis not present

## 2020-01-28 DIAGNOSIS — J0110 Acute frontal sinusitis, unspecified: Secondary | ICD-10-CM

## 2020-01-28 DIAGNOSIS — R0981 Nasal congestion: Secondary | ICD-10-CM | POA: Diagnosis present

## 2020-01-28 DIAGNOSIS — Z91013 Allergy to seafood: Secondary | ICD-10-CM | POA: Insufficient documentation

## 2020-01-28 DIAGNOSIS — Z87891 Personal history of nicotine dependence: Secondary | ICD-10-CM | POA: Insufficient documentation

## 2020-01-28 DIAGNOSIS — E860 Dehydration: Secondary | ICD-10-CM | POA: Diagnosis not present

## 2020-01-28 NOTE — ED Triage Notes (Addendum)
Pt c/o sinus pressure x 3 days , pt seen yesterday at [facility]  for same ,prescription for Claritin and Flonase which she has not used , pt states no improvement from yesterday  6/5 neg covid

## 2020-01-28 NOTE — Discharge Instructions (Signed)
You were seen in the emergency department today with sinus pressure.  Please fill your Flonase and begin using that.  You can also use saline nasal spray along with Tylenol and/or Motrin as needed for headache.  We discussed your slightly elevated blood pressure here.  If you are able, please get an automatic blood pressure cuff at home and keep a log of your blood pressures daily.  You can discuss these with your primary care doctor.

## 2020-01-28 NOTE — ED Provider Notes (Signed)
Emergency Department Provider Note   I have reviewed the triage vital signs and the nursing notes.   HISTORY  Chief Complaint Sinus Problem   HPI Brittany Mann is a 49 y.o. female with past history of seasonal allergies presents to the emergency department with nasal congestion and face pain.  Painful area is in the front of her face and associated with some sinus drainage over the past several days.  She was seen in urgent care yesterday and prescribed Flonase but has not filled that prescription or started it as of yet.  She has not developed fever.  She does have mild headache and feels that she may be slightly dehydrated.  She is been drinking Gatorade and water today to try and improve that.  She took Tylenol prior to ED presentation for mild headache.  No vision disturbance.  No radiation of symptoms or other modifying factors.    Past Medical History:  Diagnosis Date  . Environmental and seasonal allergies     There are no problems to display for this patient.   Past Surgical History:  Procedure Laterality Date  . ECTOPIC PREGNANCY SURGERY      Allergies Shellfish allergy  History reviewed. No pertinent family history.  Social History Social History   Tobacco Use  . Smoking status: Former Games developer  . Smokeless tobacco: Never Used  Substance Use Topics  . Alcohol use: No  . Drug use: No    Review of Systems  Constitutional: No fever/chills Eyes: No visual changes . ENT: No sore throat. Positive nasal discharge and face pain.  Cardiovascular: Denies chest pain. Respiratory: Denies shortness of breath. Gastrointestinal: No abdominal pain.  No nausea, no vomiting.  No diarrhea.  No constipation. Genitourinary: Negative for dysuria. Musculoskeletal: Negative for back pain. Skin: Negative for rash. Neurological: Negative for focal weakness or numbness. Positive mild HA.   10-point ROS otherwise  negative.  ____________________________________________   PHYSICAL EXAM:  VITAL SIGNS: ED Triage Vitals  Enc Vitals Group     BP 01/28/20 1431 (!) 151/86     Pulse Rate 01/28/20 1431 86     Resp 01/28/20 1431 16     Temp 01/28/20 1431 98 F (36.7 C)     Temp Source 01/28/20 1431 Oral     SpO2 01/28/20 1431 99 %     Weight 01/28/20 1431 106 lb (48.1 kg)     Height 01/28/20 1431 5\' 4"  (1.626 m)   Constitutional: Alert and oriented. Well appearing and in no acute distress. Eyes: Conjunctivae are normal.  Head: Atraumatic. Ears:  Healthy appearing ear canals and TMs bilaterally Nose: Mild congestion/rhinnorhea. Mild tenderness over the frontal sinuses.  Mouth/Throat: Mucous membranes are moist.  Oropharynx non-erythematous. Neck: No stridor.   Cardiovascular: Good peripheral circulation.  Respiratory: Normal respiratory effort.   Gastrointestinal: No distention.  Musculoskeletal:  No gross deformities of extremities. Neurologic:  Normal speech and language.  Skin:  Skin is warm, dry and intact. No rash noted.  ____________________________________________   PROCEDURES  Procedure(s) performed:   Procedures  None  ____________________________________________   INITIAL IMPRESSION / ASSESSMENT AND PLAN / ED COURSE  Pertinent labs & imaging results that were available during my care of the patient were reviewed by me and considered in my medical decision making (see chart for details).   Patient presents emergency department with sinus congestion and frontal sinus discomfort.  No fevers here.  Patient's pain only began today with sinus symptoms only over the past 3  days.  She does not meet criteria for antibiotic treatment at this time and I discussed this with her.  Advised her to fill the Flonase and can also use Nettie pot or saline spray to further treat symptoms.  She plans to take Tylenol and or Motrin as needed for headache pain.  Encouraged oral hydration at home.  I  do not see an indication for lab work or IV fluids at this time.  Patient does have mildly elevated blood pressure.  We discussed getting a home blood pressure monitor and keep a daily log of her blood pressures to share with her primary care doctor.  Patient plans on doing this.  Discussed ED return precautions in detail.  I do not see an indication for repeat Covid testing.  She was tested 3 days ago and this was negative.  Her symptoms are mild and not consistent with severe Covid infection.    ____________________________________________  FINAL CLINICAL IMPRESSION(S) / ED DIAGNOSES  Final diagnoses:  Acute non-recurrent frontal sinusitis    Note:  This document was prepared using Dragon voice recognition software and may include unintentional dictation errors.  Nanda Quinton, MD, [facility] Emergency Medicine    Reneta Niehaus, Wonda Olds, MD 01/28/20 [phone]

## 2022-12-29 ENCOUNTER — Emergency Department (HOSPITAL_BASED_OUTPATIENT_CLINIC_OR_DEPARTMENT_OTHER)
Admission: EM | Admit: 2022-12-29 | Discharge: 2022-12-29 | Disposition: A | Discharge status: Home / Self Care | Payer: Medicaid Other | Attending: Emergency Medicine | Admitting: Emergency Medicine

## 2022-12-29 ENCOUNTER — Encounter (HOSPITAL_BASED_OUTPATIENT_CLINIC_OR_DEPARTMENT_OTHER): Payer: Self-pay | Admitting: Emergency Medicine

## 2022-12-29 ENCOUNTER — Other Ambulatory Visit: Payer: Self-pay

## 2022-12-29 DIAGNOSIS — E876 Hypokalemia: Secondary | ICD-10-CM | POA: Diagnosis not present

## 2022-12-29 DIAGNOSIS — R030 Elevated blood-pressure reading, without diagnosis of hypertension: Secondary | ICD-10-CM | POA: Diagnosis present

## 2022-12-29 DIAGNOSIS — I10 Essential (primary) hypertension: Secondary | ICD-10-CM | POA: Diagnosis not present

## 2022-12-29 DIAGNOSIS — Z79899 Other long term (current) drug therapy: Secondary | ICD-10-CM | POA: Insufficient documentation

## 2022-12-29 HISTORY — DX: Essential (primary) hypertension: I10

## 2022-12-29 LAB — URINALYSIS, MICROSCOPIC (REFLEX)

## 2022-12-29 LAB — URINALYSIS, ROUTINE W REFLEX MICROSCOPIC
Bilirubin Urine: NEGATIVE
Glucose, UA: NEGATIVE mg/dL
Ketones, ur: NEGATIVE mg/dL
Leukocytes,Ua: NEGATIVE
Nitrite: NEGATIVE
Protein, ur: NEGATIVE mg/dL
Specific Gravity, Urine: 1.015 (ref 1.005–1.030)
pH: 7 (ref 5.0–8.0)

## 2022-12-29 LAB — CBC WITH DIFFERENTIAL/PLATELET
Abs Immature Granulocytes: 0 10*3/uL (ref 0.00–0.07)
Basophils Absolute: 0 10*3/uL (ref 0.0–0.1)
Basophils Relative: 1 %
Eosinophils Absolute: 0 10*3/uL (ref 0.0–0.5)
Eosinophils Relative: 1 %
HCT: 33.7 % — ABNORMAL LOW (ref 36.0–46.0)
Hemoglobin: 11.4 g/dL — ABNORMAL LOW (ref 12.0–15.0)
Immature Granulocytes: 0 %
Lymphocytes Relative: 29 %
Lymphs Abs: 1.1 10*3/uL (ref 0.7–4.0)
MCH: 30.1 pg (ref 26.0–34.0)
MCHC: 33.8 g/dL (ref 30.0–36.0)
MCV: 88.9 fL (ref 80.0–100.0)
Monocytes Absolute: 0.3 10*3/uL (ref 0.1–1.0)
Monocytes Relative: 7 %
Neutro Abs: 2.4 10*3/uL (ref 1.7–7.7)
Neutrophils Relative %: 62 %
Platelets: 209 10*3/uL (ref 150–400)
RBC: 3.79 MIL/uL — ABNORMAL LOW (ref 3.87–5.11)
RDW: 13.2 % (ref 11.5–15.5)
WBC: 3.8 10*3/uL — ABNORMAL LOW (ref 4.0–10.5)
nRBC: 0 % (ref 0.0–0.2)

## 2022-12-29 LAB — BASIC METABOLIC PANEL
Anion gap: 8 (ref 5–15)
BUN: 11 mg/dL (ref 6–20)
CO2: 26 mmol/L (ref 22–32)
Calcium: 9.1 mg/dL (ref 8.9–10.3)
Chloride: 105 mmol/L (ref 98–111)
Creatinine, Ser: 0.55 mg/dL (ref 0.44–1.00)
GFR, Estimated: >60 mL/min (ref >60)
Glucose, Bld: 90 mg/dL (ref 70–99)
Potassium: 3.3 mmol/L — ABNORMAL LOW (ref 3.5–5.1)
Sodium: 139 mmol/L (ref 135–145)

## 2022-12-29 MED ORDER — HYDROCHLOROTHIAZIDE 25 MG PO TABS: 25.0000 mg | ORAL_TABLET | Freq: Every day | ORAL | 2 refills | Status: AC

## 2022-12-29 MED ORDER — POTASSIUM CHLORIDE CRYS ER 20 MEQ PO TBCR
40.0000 meq | EXTENDED_RELEASE_TABLET | Freq: Once | ORAL | Status: AC
  Administered 2022-12-29: 40 meq via ORAL
  Filled 2022-12-29: qty 2

## 2022-12-29 NOTE — ED Triage Notes (Signed)
Patient arrives ambulatory by POV c/o hypertension since Monday. States she went to [facility] and was told it was normal. Micah Flesher to gynecologist today and was told her BP was elevated. Patient believes it was related to anxiety and states she is feeling very anxious right now.

## 2022-12-29 NOTE — ED Provider Notes (Signed)
[place] EMERGENCY DEPARTMENT AT [facility] Provider Note   CSN: [id number] Arrival date & time: 12/29/22  1725     History  Chief Complaint  Patient presents with   Hypertension    Brittany Mann is a 52 y.o. female.  With a history of hypertension who presents to the ED for evaluation of high blood pressure readings.  She states that she has a history of hypertension and was put on hydrochlorothiazide approximately 2 years ago but was taken off of this because her blood pressure was well-controlled.  She has been checking her blood pressure at home recently and found elevated readings to 160/100.  She presented to urgent care on Monday and was encouraged to follow-up with her primary care provider.  She then followed up with gynecology and had an elevated blood pressure of 180/110 today.  She was encouraged to follow-up with her primary care provider as soon as possible.  She states that her PCP is currently on maternity leave and she does not have an alternative PCP.  Thus she presented to the emergency department.  She denies all symptoms including chest pain, shortness of breath, headaches, vision changes, dizziness, lightheadedness, numbness, weakness, tingling, abdominal pain, nausea, vomiting.   Hypertension       Home Medications Prior to Admission medications   Medication Sig Start Date End Date Taking? Authorizing Provider  hydrochlorothiazide (HYDRODIURIL) 25 MG tablet Take 1 tablet (25 mg total) by mouth daily. 12/29/22 01/28/23 Yes Tyland Klemens, Edsel Petrin, PA-C  Cetirizine HCl 10 MG CAPS Take 1 capsule (10 mg total) by mouth daily. 12/30/15   Vanetta Mulders, MD  IRON, FERROUS GLUCONATE, PO Take by mouth.    [provider]  naproxen (NAPROSYN) 500 MG tablet Take 1 tablet (500 mg total) by mouth 2 (two) times daily. 12/30/15   Vanetta Mulders, MD  penicillin v potassium (VEETID) 500 MG tablet Take 1 tablet (500 mg total) by mouth 2 (two) times daily.  09/11/16   Rolan Bucco, MD      Allergies    Shellfish allergy    Review of Systems   Review of Systems  All other systems reviewed and are negative.   Physical Exam Updated Vital Signs BP (!) 174/84 (BP Location: Left Arm)   Pulse (!) 111   Temp 98.7 F (37.1 C) (Oral)   Resp 18   Ht 5\' 4"  (1.626 m)   Wt 48.1 kg   SpO2 100%   BMI 18.19 kg/m  Physical Exam Vitals and nursing note reviewed.  Constitutional:      General: She is not in acute distress.    Appearance: She is well-developed.  HENT:     Head: Normocephalic and atraumatic.  Eyes:     Conjunctiva/sclera: Conjunctivae normal.  Cardiovascular:     Rate and Rhythm: Normal rate and regular rhythm.     Heart sounds: No murmur heard. Pulmonary:     Effort: Pulmonary effort is normal. No respiratory distress.     Breath sounds: Normal breath sounds. No wheezing, rhonchi or rales.  Abdominal:     Palpations: Abdomen is soft.     Tenderness: There is no abdominal tenderness.  Musculoskeletal:        General: No swelling.     Cervical back: Neck supple.  Skin:    General: Skin is warm and dry.     Capillary Refill: Capillary refill takes less than 2 seconds.  Neurological:     Mental Status: She is  alert.  Psychiatric:        Mood and Affect: Mood normal.     ED Results / Procedures / Treatments   Labs (all labs ordered are listed, but only abnormal results are displayed) Labs Reviewed  BASIC METABOLIC PANEL - Abnormal; Notable for the following components:      Result Value   Potassium 3.3 (*)    All other components within normal limits  CBC WITH DIFFERENTIAL/PLATELET - Abnormal; Notable for the following components:   WBC 3.8 (*)    RBC 3.79 (*)    Hemoglobin 11.4 (*)    HCT 33.7 (*)    All other components within normal limits  URINALYSIS, ROUTINE W REFLEX MICROSCOPIC - Abnormal; Notable for the following components:   Hgb urine dipstick TRACE (*)    All other components within normal limits   URINALYSIS, MICROSCOPIC (REFLEX) - Abnormal; Notable for the following components:   Bacteria, UA RARE (*)    All other components within normal limits    EKG EKG Interpretation  Date/Time:  Thursday Dec 29 2022 18:36:41 EDT Ventricular Rate:  99 PR Interval:  119 QRS Duration: 81 QT Interval:  371 QTC Calculation: 477 R Axis:   43 Text Interpretation: Sinus rhythm Biatrial enlargement Borderline repolarization abnormality Confirmed by Vonita Moss [phone]) on 12/29/2022 6:38:41 PM  Radiology No results found.  Procedures Procedures    Medications Ordered in ED Medications  potassium chloride SA (KLOR-CON M) CR tablet 40 mEq (has no administration in time range)    ED Course/ Medical Decision Making/ A&P                             Medical Decision Making Amount and/or Complexity of Data Reviewed Labs: ordered.  This patient presents to the ED for concern of asymptomatic hypertension, this involves an extensive number of treatment options.  Co morbidities that complicate the patient evaluation   hypertension previously treated with hydrochlorothiazide  My initial workup includes basic labs, EKG  Additional history obtained from: Nursing notes from this visit.  I ordered, reviewed and interpreted labs which include: BMP, CBC.  Stable anemia with a hemoglobin of 11.4.  Slight hypokalemia of 3.3.  Labs otherwise reassuring  Afebrile, hypertensive but otherwise hemodynamically stable.  52 year old female presenting to the ED for evaluation of elevated blood pressure readings.  She is very anxious about this.  Has a history of hypertension and was treated with hydrochlorothiazide but was taken off of this approximately 2 years ago because her blood pressure was well-controlled.  She has had elevated readings recently and is very anxious about it.  She denies all symptoms today.  She looks very well on exam.  Given her documented blood pressure readings of greater than  180 over greater than 100 recently, basic workup was initiated.  Workup was reassuring.  No evidence of end-organ dysfunction.  Mild hypokalemia was treated in the ED.  Patient was encouraged to follow-up with her primary care provider when possible as this provider is currently on leave.  She was started on a low-dose of hydrochlorothiazide as she has had success with this in the past.  She was given return precautions.  Stable at discharge.  At this time there does not appear to be any evidence of an acute emergency medical condition and the patient appears stable for discharge with appropriate outpatient follow up. Diagnosis was discussed with patient who verbalizes understanding of care plan and  is agreeable to discharge. I have discussed return precautions with patient who verbalizes understanding. Patient encouraged to follow-up with their PCP within 1 week. All questions answered.  Note: Portions of this report may have been transcribed using voice recognition software. Every effort was made to ensure accuracy; however, inadvertent computerized transcription errors may still be present.        Final Clinical Impression(s) / ED Diagnoses Final diagnoses:  Uncontrolled hypertension    Rx / DC Orders ED Discharge Orders          Ordered    hydrochlorothiazide (HYDRODIURIL) 25 MG tablet  Daily        12/29/22 1851              Mora Bellman 12/29/22 1852    Rondel Baton, MD 12/30/22 1323

## 2022-12-29 NOTE — ED Notes (Addendum)
Reviewed discharge instructions and recommendations with pt. Pt aware of prescription to be picked up Pt hesitant about taking potassium supplement wanting to talk with sister first . Explained benefits and need to take potasium

## 2022-12-29 NOTE — Discharge Instructions (Addendum)
You have been seen today for your complaint of high blood pressure. Your lab work can be reviewed in Pharmacologist. Your discharge medications include hydrochlorothiazide.  This is a blood pressure medicine.  Take it as prescribed. Home care instructions are as follows:  Check your blood pressure in the mornings before you get out of bed and record these readings to present to your primary care provider  Follow up with:your primary care provider when possible Please seek immediate medical care if you develop any of the following symptoms: You get a very bad headache. You start to feel mixed up (confused). You feel weak or numb. You feel faint. You have very bad pain in your: Chest. Belly (abdomen). You vomit more than once. You have trouble breathing. At this time there does not appear to be the presence of an emergent medical condition, however there is always the potential for conditions to change. Please read and follow the below instructions.  Do not take your medicine if  develop an itchy rash, swelling in your mouth or lips, or difficulty breathing; call 911 and seek immediate emergency medical attention if this occurs.  You may review your lab tests and imaging results in their entirety on your MyChart account.  Please discuss all results of fully with your primary care provider and other specialist at your follow-up visit.  Note: Portions of this text may have been transcribed using voice recognition software. Every effort was made to ensure accuracy; however, inadvertent computerized transcription errors may still be present.
